# Patient Record
Sex: Female | Born: 1985 | Race: Black or African American | Hispanic: No | Marital: Married | Smoking: Former smoker
Health system: Southern US, Community
[De-identification: ages and names within clinical notes are randomized; demographics above are authoritative.]

## PROBLEM LIST (undated history)

## (undated) DIAGNOSIS — E282 Polycystic ovarian syndrome: Secondary | ICD-10-CM

## (undated) HISTORY — PX: APPENDECTOMY: SHX54

## (undated) HISTORY — PX: OTHER SURGICAL HISTORY: SHX169

---
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
## 2004-01-22 ENCOUNTER — Emergency Department (HOSPITAL_COMMUNITY): Admission: EM | Admit: 2004-01-22 | Discharge: 2004-01-22 | Payer: Self-pay | Admitting: Family Medicine

## 2005-02-25 ENCOUNTER — Inpatient Hospital Stay (HOSPITAL_COMMUNITY): Admission: AD | Admit: 2005-02-25 | Discharge: 2005-02-25 | Payer: Self-pay | Admitting: Obstetrics and Gynecology

## 2006-04-13 ENCOUNTER — Emergency Department (HOSPITAL_COMMUNITY): Admission: EM | Admit: 2006-04-13 | Discharge: 2006-04-13 | Payer: Self-pay | Admitting: Emergency Medicine

## 2006-11-28 ENCOUNTER — Emergency Department (HOSPITAL_COMMUNITY): Admission: EM | Admit: 2006-11-28 | Discharge: 2006-11-28 | Payer: Self-pay | Admitting: Family Medicine

## 2008-01-26 ENCOUNTER — Emergency Department (HOSPITAL_COMMUNITY): Admission: EM | Admit: 2008-01-26 | Discharge: 2008-01-26 | Payer: Self-pay | Admitting: Family Medicine

## 2008-03-17 ENCOUNTER — Emergency Department (HOSPITAL_COMMUNITY): Admission: EM | Admit: 2008-03-17 | Discharge: 2008-03-17 | Payer: Self-pay | Admitting: Emergency Medicine

## 2008-05-10 ENCOUNTER — Ambulatory Visit: Payer: Self-pay | Admitting: Nurse Practitioner

## 2008-05-10 DIAGNOSIS — S99919A Unspecified injury of unspecified ankle, initial encounter: Secondary | ICD-10-CM

## 2008-05-10 DIAGNOSIS — S99929A Unspecified injury of unspecified foot, initial encounter: Secondary | ICD-10-CM

## 2008-05-10 DIAGNOSIS — F17200 Nicotine dependence, unspecified, uncomplicated: Secondary | ICD-10-CM

## 2008-05-10 DIAGNOSIS — S8990XA Unspecified injury of unspecified lower leg, initial encounter: Secondary | ICD-10-CM | POA: Insufficient documentation

## 2008-05-10 HISTORY — DX: Nicotine dependence, unspecified, uncomplicated: F17.200

## 2008-05-12 ENCOUNTER — Ambulatory Visit (HOSPITAL_COMMUNITY): Admission: RE | Admit: 2008-05-12 | Discharge: 2008-05-12 | Payer: Self-pay | Admitting: Internal Medicine

## 2008-05-16 DIAGNOSIS — S83509A Sprain of unspecified cruciate ligament of unspecified knee, initial encounter: Secondary | ICD-10-CM

## 2008-05-16 HISTORY — DX: Sprain of unspecified cruciate ligament of unspecified knee, initial encounter: S83.509A

## 2008-06-14 ENCOUNTER — Encounter (INDEPENDENT_AMBULATORY_CARE_PROVIDER_SITE_OTHER): Payer: Self-pay | Admitting: Nurse Practitioner

## 2009-02-15 ENCOUNTER — Inpatient Hospital Stay (HOSPITAL_COMMUNITY): Admission: AD | Admit: 2009-02-15 | Discharge: 2009-02-15 | Payer: Self-pay | Admitting: Obstetrics & Gynecology

## 2009-05-10 ENCOUNTER — Ambulatory Visit (HOSPITAL_COMMUNITY): Admission: RE | Admit: 2009-05-10 | Discharge: 2009-05-10 | Payer: Self-pay | Admitting: Obstetrics

## 2010-10-18 LAB — GC/CHLAMYDIA PROBE AMP, GENITAL: Chlamydia, DNA Probe: POSITIVE — AB

## 2010-10-18 LAB — URINALYSIS, ROUTINE W REFLEX MICROSCOPIC
Glucose, UA: NEGATIVE mg/dL
Ketones, ur: NEGATIVE mg/dL
Nitrite: NEGATIVE
Protein, ur: NEGATIVE mg/dL
Urobilinogen, UA: 0.2 mg/dL (ref 0.0–1.0)
pH: 6 (ref 5.0–8.0)

## 2010-10-18 LAB — URINE MICROSCOPIC-ADD ON

## 2010-10-18 LAB — WET PREP, GENITAL: Trich, Wet Prep: NONE SEEN

## 2010-12-04 ENCOUNTER — Other Ambulatory Visit (HOSPITAL_COMMUNITY): Payer: Self-pay | Admitting: Obstetrics and Gynecology

## 2010-12-04 DIAGNOSIS — N979 Female infertility, unspecified: Secondary | ICD-10-CM

## 2011-05-05 ENCOUNTER — Inpatient Hospital Stay (INDEPENDENT_AMBULATORY_CARE_PROVIDER_SITE_OTHER)
Admission: RE | Admit: 2011-05-05 | Discharge: 2011-05-05 | Disposition: A | Discharge status: Home / Self Care | Payer: Self-pay | Source: Ambulatory Visit | Attending: Family Medicine | Admitting: Family Medicine

## 2011-05-05 DIAGNOSIS — N390 Urinary tract infection, site not specified: Secondary | ICD-10-CM

## 2011-05-05 LAB — POCT URINALYSIS DIP (DEVICE)
Nitrite: NEGATIVE
Specific Gravity, Urine: >=1.03 (ref 1.005–1.030)
Urobilinogen, UA: 0.2 mg/dL (ref 0.0–1.0)

## 2011-07-08 ENCOUNTER — Ambulatory Visit (HOSPITAL_COMMUNITY)
Admission: RE | Admit: 2011-07-08 | Discharge: 2011-07-08 | Disposition: A | Discharge status: Home / Self Care | Payer: Self-pay | Source: Ambulatory Visit | Attending: Obstetrics and Gynecology | Admitting: Obstetrics and Gynecology

## 2011-07-08 DIAGNOSIS — N979 Female infertility, unspecified: Secondary | ICD-10-CM | POA: Insufficient documentation

## 2011-07-08 MED ORDER — IOHEXOL 300 MG/ML  SOLN
20.0000 mL | Freq: Once | INTRAMUSCULAR | Status: AC | PRN
  Administered 2011-07-08: 20 mL

## 2011-09-09 ENCOUNTER — Encounter (HOSPITAL_COMMUNITY): Payer: Self-pay | Admitting: *Deleted

## 2011-09-09 ENCOUNTER — Emergency Department (HOSPITAL_COMMUNITY)
Admission: EM | Admit: 2011-09-09 | Discharge: 2011-09-09 | Disposition: A | Discharge status: Home / Self Care | Payer: No Typology Code available for payment source | Attending: Emergency Medicine | Admitting: Emergency Medicine

## 2011-09-09 DIAGNOSIS — T1490XA Injury, unspecified, initial encounter: Secondary | ICD-10-CM | POA: Insufficient documentation

## 2011-09-09 DIAGNOSIS — M25559 Pain in unspecified hip: Secondary | ICD-10-CM | POA: Insufficient documentation

## 2011-09-09 DIAGNOSIS — M25551 Pain in right hip: Secondary | ICD-10-CM

## 2011-09-09 HISTORY — DX: Polycystic ovarian syndrome: E28.2

## 2011-09-09 MED ORDER — ORPHENADRINE CITRATE ER 100 MG PO TB12: 100.0000 mg | ORAL_TABLET | Freq: Two times a day (BID) | ORAL | Status: AC

## 2011-09-09 MED ORDER — IBUPROFEN 800 MG PO TABS: 800.0000 mg | ORAL_TABLET | Freq: Three times a day (TID) | ORAL | Status: AC | PRN
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
## 2011-09-09 NOTE — ED Provider Notes (Signed)
Medical screening examination/treatment/procedure(s) were performed by non-physician practitioner and as supervising physician I was immediately available for consultation/collaboration.  Savior Himebaugh, MD 09/09/11 1811 
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
## 2011-09-10 ENCOUNTER — Emergency Department (HOSPITAL_COMMUNITY): Payer: No Typology Code available for payment source

## 2011-09-10 ENCOUNTER — Emergency Department (HOSPITAL_COMMUNITY)
Admission: EM | Admit: 2011-09-10 | Discharge: 2011-09-10 | Disposition: A | Discharge status: Home / Self Care | Payer: No Typology Code available for payment source | Attending: Emergency Medicine | Admitting: Emergency Medicine

## 2011-09-10 ENCOUNTER — Telehealth (HOSPITAL_COMMUNITY): Payer: Self-pay | Admitting: *Deleted

## 2011-09-10 ENCOUNTER — Encounter (HOSPITAL_COMMUNITY): Payer: Self-pay | Admitting: *Deleted

## 2011-09-10 DIAGNOSIS — M545 Low back pain, unspecified: Secondary | ICD-10-CM | POA: Insufficient documentation

## 2011-09-10 DIAGNOSIS — E282 Polycystic ovarian syndrome: Secondary | ICD-10-CM | POA: Insufficient documentation

## 2011-09-10 DIAGNOSIS — M25559 Pain in unspecified hip: Secondary | ICD-10-CM | POA: Insufficient documentation

## 2011-09-10 DIAGNOSIS — M546 Pain in thoracic spine: Secondary | ICD-10-CM | POA: Insufficient documentation

## 2011-09-10 DIAGNOSIS — F17200 Nicotine dependence, unspecified, uncomplicated: Secondary | ICD-10-CM | POA: Insufficient documentation

## 2011-09-10 MED ORDER — HYDROCODONE-ACETAMINOPHEN 5-325 MG PO TABS
1.0000 | ORAL_TABLET | Freq: Once | ORAL | Status: AC
  Administered 2011-09-10: 1 via ORAL
  Filled 2011-09-10: qty 1

## 2011-09-10 MED ORDER — HYDROCODONE-ACETAMINOPHEN 5-325 MG PO TABS: 2.0000 | ORAL_TABLET | ORAL | Status: AC | PRN
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
## 2011-09-10 NOTE — ED Notes (Signed)
[facility] called and requested prior authorization for Norflex. I told her we don't do prior authorizations. I accessed chart and pt. as seen in ED on 2/27. I referred her to the flow manager [phone] to see if medication can be changed to something else. Vassie Moselle 09/10/2011
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
## 2011-09-12 NOTE — ED Provider Notes (Signed)
Medical screening examination/treatment/procedure(s) were performed by non-physician practitioner and as supervising physician I was immediately available for consultation/collaboration.  Raeford Razor, MD 09/12/11 [phone]

## 2011-10-28 ENCOUNTER — Emergency Department (HOSPITAL_COMMUNITY)
Admission: EM | Admit: 2011-10-28 | Discharge: 2011-10-29 | Disposition: A | Discharge status: Home / Self Care | Payer: Medicaid Other | Attending: Emergency Medicine | Admitting: Emergency Medicine

## 2011-10-28 ENCOUNTER — Emergency Department (HOSPITAL_COMMUNITY): Payer: Medicaid Other

## 2011-10-28 ENCOUNTER — Encounter (HOSPITAL_COMMUNITY): Payer: Self-pay | Admitting: Emergency Medicine

## 2011-10-28 DIAGNOSIS — M79609 Pain in unspecified limb: Secondary | ICD-10-CM | POA: Insufficient documentation

## 2011-10-28 DIAGNOSIS — M25569 Pain in unspecified knee: Secondary | ICD-10-CM | POA: Insufficient documentation

## 2011-10-28 DIAGNOSIS — M25562 Pain in left knee: Secondary | ICD-10-CM

## 2011-10-28 MED ORDER — KETOROLAC TROMETHAMINE 60 MG/2ML IM SOLN
60.0000 mg | Freq: Once | INTRAMUSCULAR | Status: AC
  Administered 2011-10-29: 60 mg via INTRAMUSCULAR
  Filled 2011-10-28: qty 2

## 2011-10-28 NOTE — ED Notes (Signed)
Pt states she is having pain in her left knee  Pt states her knee gave out on her this evening   Pt states she has swelling without bruising
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
## 2011-10-29 MED ORDER — HYDROCODONE-ACETAMINOPHEN 5-325 MG PO TABS: 1.0000 | ORAL_TABLET | ORAL | Status: DC | PRN

## 2011-10-29 MED ORDER — TRAMADOL HCL 50 MG PO TABS: 50.0000 mg | ORAL_TABLET | Freq: Four times a day (QID) | ORAL | Status: AC | PRN
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
## 2011-10-29 NOTE — Discharge Instructions (Signed)
Your xray was normal. Your exam did not show any obvious ligament tears. You've been placed in a knee sleeve and on crutches. Please practice RICE - rest, ice, compression, and elevation. Use the pain medication as needed. Call Dr. Stacy Gardner office to make a follow up appointment for further evaluation and treatment. Return to the ER if you develop numbness, weakness, worsening swelling, or other worrisome symptoms.  Knee Pain The knee is the complex joint between your thigh and your lower leg. It is made up of bones, tendons, ligaments, and cartilage. The bones that make up the knee are:  The femur in the thigh.   The tibia and fibula in the lower leg.   The patella or kneecap riding in the groove on the lower femur.  CAUSES  Knee pain is a common complaint with many causes. A few of these causes are:  Injury, such as:   A ruptured ligament or tendon injury.   Torn cartilage.   Medical conditions, such as:   Gout   Arthritis   Infections   Overuse, over training or overdoing a physical activity.  Knee pain can be minor or severe. Knee pain can accompany debilitating injury. Minor knee problems often respond well to self-care measures or get well on their own. More serious injuries may need medical intervention or even surgery. SYMPTOMS The knee is complex. Symptoms of knee problems can vary widely. Some of the problems are:  Pain with movement and weight bearing.   Swelling and tenderness.   Buckling of the knee.   Inability to straighten or extend your knee.   Your knee locks and you cannot straighten it.   Warmth and redness with pain and fever.   Deformity or dislocation of the kneecap.  DIAGNOSIS  Determining what is wrong may be very straight forward such as when there is an injury. It can also be challenging because of the complexity of the knee. Tests to make a diagnosis may include:  Your caregiver taking a history and doing a physical exam.   Routine X-rays  can be used to rule out other problems. X-rays will not reveal a cartilage tear. Some injuries of the knee can be diagnosed by:   Arthroscopy a surgical technique by which a small video camera is inserted through tiny incisions on the sides of the knee. This procedure is used to examine and repair internal knee joint problems. Tiny instruments can be used during arthroscopy to repair the torn knee cartilage (meniscus).   Arthrography is a radiology technique. A contrast liquid is directly injected into the knee joint. Internal structures of the knee joint then become visible on X-ray film.   An MRI scan is a non x-ray radiology procedure in which magnetic fields and a computer produce two- or three-dimensional images of the inside of the knee. Cartilage tears are often visible using an MRI scanner. MRI scans have largely replaced arthrography in diagnosing cartilage tears of the knee.   Blood work.   Examination of the fluid that helps to lubricate the knee joint (synovial fluid). This is done by taking a sample out using a needle and a syringe.  TREATMENT The treatment of knee problems depends on the cause. Some of these treatments are:  Depending on the injury, proper casting, splinting, surgery or physical therapy care will be needed.   Give yourself adequate recovery time. Do not overuse your joints. If you begin to get sore during workout routines, back off. Slow down or do  fewer repetitions.   For repetitive activities such as cycling or running, maintain your strength and nutrition.   Alternate muscle groups. For example if you are a weight lifter, work the upper body on one day and the lower body the next.   Either tight or weak muscles do not give the proper support for your knee. Tight or weak muscles do not absorb the stress placed on the knee joint. Keep the muscles surrounding the knee strong.   Take care of mechanical problems.   If you have flat feet, orthotics or special  shoes may help. See your caregiver if you need help.   Arch supports, sometimes with wedges on the inner or outer aspect of the heel, can help. These can shift pressure away from the side of the knee most bothered by osteoarthritis.   A brace called an "unloader" brace also may be used to help ease the pressure on the most arthritic side of the knee.   If your caregiver has prescribed crutches, braces, wraps or ice, use as directed. The acronym for this is PRICE. This means protection, rest, ice, compression and elevation.   Nonsteroidal anti-inflammatory drugs (NSAID's), can help relieve pain. But if taken immediately after an injury, they may actually increase swelling. Take NSAID's with food in your stomach. Stop them if you develop stomach problems. Do not take these if you have a history of ulcers, stomach pain or bleeding from the bowel. Do not take without your caregiver's approval if you have problems with fluid retention, heart failure, or kidney problems.   For ongoing knee problems, physical therapy may be helpful.   Glucosamine and chondroitin are over-the-counter dietary supplements. Both may help relieve the pain of osteoarthritis in the knee. These medicines are different from the usual anti-inflammatory drugs. Glucosamine may decrease the rate of cartilage destruction.   Injections of a corticosteroid drug into your knee joint may help reduce the symptoms of an arthritis flare-up. They may provide pain relief that lasts a few months. You may have to wait a few months between injections. The injections do have a small increased risk of infection, water retention and elevated blood sugar levels.   Hyaluronic acid injected into damaged joints may ease pain and provide lubrication. These injections may work by reducing inflammation. A series of shots may give relief for as long as 6 months.   Topical painkillers. Applying certain ointments to your skin may help relieve the pain and  stiffness of osteoarthritis. Ask your pharmacist for suggestions. Many over the-counter products are approved for temporary relief of arthritis pain.   In some countries, doctors often prescribe topical NSAID's for relief of chronic conditions such as arthritis and tendinitis. A review of treatment with NSAID creams found that they worked as well as oral medications but without the serious side effects.  PREVENTION  Maintain a healthy weight. Extra pounds put more strain on your joints.   Get strong, stay limber. Weak muscles are a common cause of knee injuries. Stretching is important. Include flexibility exercises in your workouts.   Be smart about exercise. If you have osteoarthritis, chronic knee pain or recurring injuries, you may need to change the way you exercise. This does not mean you have to stop being active. If your knees ache after jogging or playing basketball, consider switching to swimming, water aerobics or other low-impact activities, at least for a few days a week. Sometimes limiting high-impact activities will provide relief.   Make sure your shoes  fit well. Choose footwear that is right for your sport.   Protect your knees. Use the proper gear for knee-sensitive activities. Use kneepads when playing volleyball or laying carpet. Buckle your seat belt every time you drive. Most shattered kneecaps occur in car accidents.   Rest when you are tired.  SEEK MEDICAL CARE IF:  You have knee pain that is continual and does not seem to be getting better.  SEEK IMMEDIATE MEDICAL CARE IF:  Your knee joint feels hot to the touch and you have a high fever. MAKE SURE YOU:   Understand these instructions.   Will watch your condition.   Will get help right away if you are not doing well or get worse.  Document Released: 04/26/2007 Document Revised: 06/18/2011 Document Reviewed: 04/26/2007 [facility] Patient Information 2012 [place], [place].

## 2011-10-29 NOTE — ED Provider Notes (Signed)
Medical screening examination/treatment/procedure(s) were performed by non-physician practitioner and as supervising physician I was immediately available for consultation/collaboration. Jaray Boliver, MD, FACEP   Itsel Opfer L Dorethea Strubel, MD 10/29/11 2124 

## 2012-07-19 ENCOUNTER — Encounter (HOSPITAL_COMMUNITY): Payer: Self-pay | Admitting: *Deleted

## 2012-07-19 ENCOUNTER — Emergency Department (HOSPITAL_COMMUNITY)
Admission: EM | Admit: 2012-07-19 | Discharge: 2012-07-20 | Disposition: A | Discharge status: Home / Self Care | Payer: Medicaid Other | Attending: Emergency Medicine | Admitting: Emergency Medicine

## 2012-07-19 DIAGNOSIS — R209 Unspecified disturbances of skin sensation: Secondary | ICD-10-CM | POA: Insufficient documentation

## 2012-07-19 DIAGNOSIS — Z8742 Personal history of other diseases of the female genital tract: Secondary | ICD-10-CM | POA: Insufficient documentation

## 2012-07-19 DIAGNOSIS — R200 Anesthesia of skin: Secondary | ICD-10-CM

## 2012-07-19 DIAGNOSIS — Z87891 Personal history of nicotine dependence: Secondary | ICD-10-CM | POA: Insufficient documentation

## 2012-07-19 DIAGNOSIS — R1110 Vomiting, unspecified: Secondary | ICD-10-CM | POA: Insufficient documentation

## 2012-07-19 NOTE — ED Notes (Signed)
Mouth numbness for 45 minutes agio  After finding a bug in her greens that she had eaten.  She has the bug with her

## 2012-07-20 NOTE — ED Provider Notes (Signed)
History     CSN: [id number]  Arrival date & time 07/19/12  2211   None     Chief Complaint  Patient presents with  . mouth numbness     (Consider location/radiation/quality/duration/timing/severity/associated sxs/prior treatment) HPI History provided by pt.   Pt chewed on a dead cockroach from a can of collard beans yesterday evening and developed numbness right posterior mouth shortly after.  Had been chewing on that same side.  Concerned that the it may have been poisonous, though admits that her symptoms are likely psychogenic.  She has vomited twice since finding the insect in her mouth.  Past Medical History  Diagnosis Date  . Polycystic disease, ovaries     Past Surgical History  Procedure Date  . Extraction of wisdom teeth     Family History  Problem Relation Age of Onset  . Hypertension Mother   . Diabetes Other   . Hypertension Other     History  Substance Use Topics  . Smoking status: Former Smoker -- 0.5 packs/day  . Smokeless tobacco: Not on file  . Alcohol Use: No    OB History    Grav Para Term Preterm Abortions TAB SAB Ect Mult Living                  Review of Systems  All other systems reviewed and are negative.    Allergies  Review of patient's allergies indicates no known allergies.  Home Medications   Current Outpatient Rx  Name  Route  Sig  Dispense  Refill  . PRENATAL MULTIVITAMIN CH   Oral   Take 1 tablet by mouth daily.           BP 123/75  Pulse 88  Temp 98.2 F (36.8 C) (Oral)  Resp 20  SpO2 100%  Physical Exam  Nursing note and vitals reviewed. Constitutional: She is oriented to person, place, and time. She appears well-developed and well-nourished. No distress.       Pt has what appears to be half of a cockroach in a sandwich bag with her.  She begins retching when she pulls it out.   HENT:  Head: Normocephalic and atraumatic.       Braces.  Decreased sensation at gingiva posteiror to 2nd right lower molar  compared to rest of oral mucosa.  No visible edema, erythema, lesion of tissue.   Eyes:       Normal appearance  Neck: Normal range of motion.  Pulmonary/Chest: Effort normal.  Musculoskeletal: Normal range of motion.  Neurological: She is alert and oriented to person, place, and time.  Psychiatric: She has a normal mood and affect. Her behavior is normal.    ED Course  Procedures (including critical care time)  Labs Reviewed - No data to display No results found.   1. Numbness       MDM  26yo healthy F presents w/ numbness right lower posterior gingiva s/p finding a dead cockroach in collard greens yesterday evening.  Sensation is intact.  Suspect that sx are psychogenic.  Pt has been retching in ED and attributes to thought of having insect in her mouth.  She has been reassured and Return precautions discussed.         Otilio Miu, PA-C 07/20/12 2152

## 2012-07-20 NOTE — ED Provider Notes (Signed)
  Medical screening examination/treatment/procedure(s) were performed by non-physician practitioner and as supervising physician I was immediately available for consultation/collaboration.    Vida Roller, MD 07/20/12 [phone]

## 2013-12-23 ENCOUNTER — Encounter (HOSPITAL_COMMUNITY): Payer: Self-pay | Admitting: Emergency Medicine

## 2013-12-23 ENCOUNTER — Emergency Department (HOSPITAL_COMMUNITY)
Admission: EM | Admit: 2013-12-23 | Discharge: 2013-12-23 | Disposition: A | Discharge status: Home / Self Care | Payer: No Typology Code available for payment source | Attending: Emergency Medicine | Admitting: Emergency Medicine

## 2013-12-23 DIAGNOSIS — N390 Urinary tract infection, site not specified: Secondary | ICD-10-CM | POA: Insufficient documentation

## 2013-12-23 DIAGNOSIS — Z792 Long term (current) use of antibiotics: Secondary | ICD-10-CM | POA: Insufficient documentation

## 2013-12-23 DIAGNOSIS — Z87891 Personal history of nicotine dependence: Secondary | ICD-10-CM | POA: Insufficient documentation

## 2013-12-23 DIAGNOSIS — Z3202 Encounter for pregnancy test, result negative: Secondary | ICD-10-CM | POA: Insufficient documentation

## 2013-12-23 DIAGNOSIS — N898 Other specified noninflammatory disorders of vagina: Secondary | ICD-10-CM | POA: Insufficient documentation

## 2013-12-23 LAB — URINALYSIS, ROUTINE W REFLEX MICROSCOPIC
BILIRUBIN URINE: NEGATIVE
Glucose, UA: NEGATIVE mg/dL
KETONES UR: NEGATIVE mg/dL
Nitrite: POSITIVE — AB
PH: 6.5 (ref 5.0–8.0)
Protein, ur: 30 mg/dL — AB
SPECIFIC GRAVITY, URINE: 1.027 (ref 1.005–1.030)
UROBILINOGEN UA: 1 mg/dL (ref 0.0–1.0)

## 2013-12-23 LAB — WET PREP, GENITAL
CLUE CELLS WET PREP: NONE SEEN
TRICH WET PREP: NONE SEEN
WBC, Wet Prep HPF POC: NONE SEEN
Yeast Wet Prep HPF POC: NONE SEEN

## 2013-12-23 LAB — URINE MICROSCOPIC-ADD ON

## 2013-12-23 LAB — POC URINE PREG, ED: Preg Test, Ur: NEGATIVE

## 2013-12-23 MED ORDER — CEPHALEXIN 500 MG PO CAPS: 500.0000 mg | ORAL_CAPSULE | Freq: Two times a day (BID) | ORAL | Status: DC

## 2013-12-23 MED ORDER — SODIUM CHLORIDE 0.9 % IV BOLUS (SEPSIS)
1000.0000 mL | Freq: Once | INTRAVENOUS | Status: AC
  Administered 2013-12-23: 1000 mL via INTRAVENOUS

## 2013-12-23 MED ORDER — DEXTROSE 5 % IV SOLN
1.0000 g | Freq: Once | INTRAVENOUS | Status: AC
  Administered 2013-12-23: 1 g via INTRAVENOUS
  Filled 2013-12-23: qty 10

## 2013-12-23 NOTE — Discharge Instructions (Signed)
Urinary Tract Infection  Urinary tract infections (UTIs) can develop anywhere along your urinary tract. Your urinary tract is your body's drainage system for removing wastes and extra water. Your urinary tract includes two kidneys, two ureters, a bladder, and a urethra. Your kidneys are a pair of bean-shaped organs. Each kidney is about the size of your fist. They are located below your ribs, one on each side of your spine.  CAUSES  Infections are caused by microbes, which are microscopic organisms, including fungi, viruses, and bacteria. These organisms are so small that they can only be seen through a microscope. Bacteria are the microbes that most commonly cause UTIs.  SYMPTOMS   Symptoms of UTIs may vary by age and gender of the patient and by the location of the infection. Symptoms in young women typically include a frequent and intense urge to urinate and a painful, burning feeling in the bladder or urethra during urination. Older women and men are more likely to be tired, shaky, and weak and have muscle aches and abdominal pain. A fever may mean the infection is in your kidneys. Other symptoms of a kidney infection include pain in your back or sides below the ribs, nausea, and vomiting.  DIAGNOSIS  To diagnose a UTI, your caregiver will ask you about your symptoms. Your caregiver also will ask to provide a urine sample. The urine sample will be tested for bacteria and white blood cells. White blood cells are made by your body to help fight infection.  TREATMENT   Typically, UTIs can be treated with medication. Because most UTIs are caused by a bacterial infection, they usually can be treated with the use of antibiotics. The choice of antibiotic and length of treatment depend on your symptoms and the type of bacteria causing your infection.  HOME CARE INSTRUCTIONS   If you were prescribed antibiotics, take them exactly as your caregiver instructs you. Finish the medication even if you feel better after you  have only taken some of the medication.   Drink enough water and fluids to keep your urine clear or pale yellow.   Avoid caffeine, tea, and carbonated beverages. They tend to irritate your bladder.   Empty your bladder often. Avoid holding urine for long periods of time.   Empty your bladder before and after sexual intercourse.   After a bowel movement, women should cleanse from front to back. Use each tissue only once.  SEEK MEDICAL CARE IF:    You have back pain.   You develop a fever.   Your symptoms do not begin to resolve within 3 days.  SEEK IMMEDIATE MEDICAL CARE IF:    You have severe back pain or lower abdominal pain.   You develop chills.   You have nausea or vomiting.   You have continued burning or discomfort with urination.  MAKE SURE YOU:    Understand these instructions.   Will watch your condition.   Will get help right away if you are not doing well or get worse.  Document Released: 04/08/2005 Document Revised: 12/29/2011 Document Reviewed: 08/07/2011  ExitCare Patient Information 2014 [employer].

## 2013-12-23 NOTE — ED Provider Notes (Signed)
CSN: [id number]     Arrival date & time 12/23/13  0223 History   First MD Initiated Contact with Patient 12/23/13 0226     Chief Complaint  Patient presents with  . Dysuria    (Consider location/radiation/quality/duration/timing/severity/associated sxs/prior Treatment) HPI Comments: Patient is a 28 year old female who presents to the emergency department for abdominal pain. Patient states that she has been feeling a sharp pain intermittently over the last 4 days. Patient has the pain only right before urinating or after urinating. She states the pain is present in her right lower abdomen and right flank. Patient states that she experienced some dysuria 2 days ago, but this resolved. She also endorses foul-smelling urine as well as off white vaginal discharge. Patient denies associated fever or, nausea or vomiting, diarrhea, vaginal bleeding, urinary urgency or frequency, and pain with intercourse. Patient states her last menstrual period was in April. Patient states she has no concern for pregnancy; however, she states she is sexually active without the use of birth control or protection. No history of abdominal surgeries.  Patient is a 28 y.o. female presenting with dysuria. The history is provided by the patient. No language interpreter was used.  Dysuria Associated symptoms: abdominal pain and vaginal discharge   Associated symptoms: no fever, no nausea and no vomiting     Past Medical History  Diagnosis Date  . Polycystic disease, ovaries    Past Surgical History  Procedure Laterality Date  . Extraction of wisdom teeth     Family History  Problem Relation Age of Onset  . Hypertension Mother   . Diabetes Other   . Hypertension Other    History  Substance Use Topics  . Smoking status: Former Smoker -- 0.50 packs/day  . Smokeless tobacco: Not on file  . Alcohol Use: Yes     Comment: occasional   OB History   Grav Para Term Preterm Abortions TAB SAB Ect Mult Living        Review of Systems  Constitutional: Negative for fever.  Respiratory: Negative for shortness of breath.   Cardiovascular: Negative for chest pain.  Gastrointestinal: Positive for abdominal pain. Negative for nausea, vomiting and diarrhea.  Genitourinary: Positive for dysuria and vaginal discharge. Negative for hematuria, decreased urine volume, vaginal bleeding, difficulty urinating and pelvic pain.  Neurological: Negative for syncope.  All other systems reviewed and are negative.    Allergies  Review of patient's allergies indicates no known allergies.  Home Medications   Prior to Admission medications   Medication Sig Start Date End Date Taking? Authorizing Provider  Prenatal Vit-Fe Fumarate-FA (PRENATAL MULTIVITAMIN) TABS Take 1 tablet by mouth daily.   Yes Historical Provider, MD  cephALEXin (KEFLEX) 500 MG capsule Take 1 capsule (500 mg total) by mouth 2 (two) times daily. 12/23/13   Antony MaduraKelly Jojo Geving, PA-C   BP 118/85  Pulse 83  Temp(Src) 98.1 F (36.7 C) (Oral)  Resp 18  Ht 5\' 4"  (1.626 m)  Wt 160 lb (72.576 kg)  BMI 27.45 kg/m2  SpO2 100%  LMP 10/30/2013  Physical Exam  Nursing note and vitals reviewed. Constitutional: She is oriented to person, place, and time. She appears well-developed and well-nourished. No distress.  Nontoxic/nonseptic appearing  HENT:  Head: Normocephalic and atraumatic.  Eyes: Conjunctivae and EOM are normal. No scleral icterus.  Neck: Normal range of motion.  Cardiovascular: Normal rate, regular rhythm and normal heart sounds.   Pulmonary/Chest: Effort normal and breath sounds normal. No respiratory distress. She has no wheezes. She  has no rales.  Chest expansion symmetric  Abdominal: Soft. She exhibits no distension. There is tenderness. There is no rebound and no guarding.  Abdomen soft. Mild tenderness to deep palpation in the suprapubic region. Mild R CVA TTP. No peritoneal signs.  Genitourinary: There is no rash, tenderness, lesion  or injury on the right labia. There is no rash, tenderness, lesion or injury on the left labia. Uterus is not enlarged and not fixed. Cervix exhibits no motion tenderness and no friability. Right adnexum displays no mass and no fullness. Left adnexum displays no mass and no fullness. No erythema or tenderness around the vagina. Vaginal discharge (white, thick) found.  Musculoskeletal: Normal range of motion.  Neurological: She is alert and oriented to person, place, and time.  Skin: Skin is warm and dry. No rash noted. She is not diaphoretic. No erythema. No pallor.  Psychiatric: She has a normal mood and affect. Her behavior is normal.    ED Course  Procedures (including critical care time) Labs Review Labs Reviewed  URINALYSIS, ROUTINE W REFLEX MICROSCOPIC - Abnormal; Notable for the following:    APPearance CLOUDY (*)    Hgb urine dipstick SMALL (*)    Protein, ur 30 (*)    Nitrite POSITIVE (*)    Leukocytes, UA SMALL (*)    All other components within normal limits  URINE MICROSCOPIC-ADD ON - Abnormal; Notable for the following:    Bacteria, UA FEW (*)    All other components within normal limits  WET PREP, GENITAL  GC/CHLAMYDIA PROBE AMP  URINE CULTURE  POC URINE PREG, ED    Imaging Review No results found.   EKG Interpretation None      MDM   Final diagnoses:  UTI (urinary tract infection)    Patient presents for dysuria, suprapubic abdominal discomfort and mild, sporadic pain in R flank. Patient well and nontoxic appearing, hemodynamically stable, and afebrile. Physical exam significant for mild tenderness to deep palpation in the suprapubic region as well has mild CVA tenderness. Abdomen soft without peritoneal signs.  Workup today included urinalysis which is suggestive of urinary tract infection. Urine pregnancy negative. Pelvic exam also completed; pelvic exam without distinct tenderness. No cervical motion tenderness. Wet prep negative for yeast, Trichomonas,  clue cells, and white blood cells. Given associated dysuria and urinalysis, will treat for urinary tract infection. Urine culture pending Sporadic pain and flank may be representative of early pyelonephritis; 1 g IV Rocephin administered.  Patient lying in exam room bed in no visible or audible discomfort. Abdominal reexamination improved from initial exam as patient with no significant tenderness while distracted. She is stable for discharge with prescription for Keflex. Primary care followup advised and return precautions provided. Patient agreeable to plan with no unaddressed concerns.   Filed Vitals:   12/23/13 0229 12/23/13 0502  BP: 129/83 118/85  Pulse: 81 83  Temp: 99.1 F (37.3 C) 98.1 F (36.7 C)  TempSrc: Oral Oral  Resp: 16 18  Height: 5\' 4"  (1.626 m)   Weight: 160 lb (72.576 kg)   SpO2: 100% 100%       Antony MaduraKelly Baani Bober, PA-C 12/23/13 [phone]

## 2013-12-23 NOTE — ED Notes (Signed)
Pt reports dysuria, foul smelling urine, off-white vaginal discharge, and pelvic cramping/ pressure x 4-5 days.

## 2013-12-24 NOTE — ED Provider Notes (Signed)
Medical screening examination/treatment/procedure(s) were performed by non-physician practitioner and as supervising physician I was immediately available for consultation/collaboration.   EKG Interpretation None       Derwood KaplanAnkit Dionna Wiedemann, MD 12/24/13 [phone]

## 2013-12-25 LAB — URINE CULTURE: Colony Count: >=100000

## 2013-12-25 LAB — GC/CHLAMYDIA PROBE AMP
CT PROBE, AMP APTIMA: NEGATIVE
GC PROBE AMP APTIMA: NEGATIVE

## 2013-12-26 ENCOUNTER — Telehealth (HOSPITAL_BASED_OUTPATIENT_CLINIC_OR_DEPARTMENT_OTHER): Payer: Self-pay | Admitting: Emergency Medicine

## 2013-12-26 NOTE — Telephone Encounter (Signed)
Post ED Visit - Positive Culture Follow-up  Culture report reviewed by antimicrobial stewardship pharmacist: []  Wes Dulaney, Pharm.D., BCPS [x]  Celedonio MiyamotoJeremy Frens, Pharm.D., BCPS []  Georgina PillionElizabeth Martin, Pharm.D., BCPS []  Muhlenberg ParkMinh Pham, [address] Rainbow BoulevardPharm.D., BCPS, AAHIVP []  Estella HuskMichelle Turner, Pharm.D., BCPS, AAHIVP []  Harvie JuniorNathan Cope, Pharm.D.  Positive urine culture Treated with Keflex, organism sensitive to the same and no further patient follow-up is required at this time.  Glenn SpringsHolland, Jenel LucksKylie 12/26/2013, 2:18 PM

## 2014-12-15 DIAGNOSIS — Z8639 Personal history of other endocrine, nutritional and metabolic disease: Secondary | ICD-10-CM | POA: Insufficient documentation

## 2014-12-15 DIAGNOSIS — R030 Elevated blood-pressure reading, without diagnosis of hypertension: Secondary | ICD-10-CM | POA: Insufficient documentation

## 2014-12-15 DIAGNOSIS — R0789 Other chest pain: Secondary | ICD-10-CM | POA: Insufficient documentation

## 2014-12-15 DIAGNOSIS — Z87891 Personal history of nicotine dependence: Secondary | ICD-10-CM | POA: Insufficient documentation

## 2014-12-15 DIAGNOSIS — R05 Cough: Secondary | ICD-10-CM | POA: Insufficient documentation

## 2014-12-16 ENCOUNTER — Emergency Department (HOSPITAL_COMMUNITY): Payer: Medicaid Other

## 2014-12-16 ENCOUNTER — Emergency Department (HOSPITAL_COMMUNITY)
Admission: EM | Admit: 2014-12-16 | Discharge: 2014-12-16 | Disposition: A | Discharge status: Home / Self Care | Payer: Medicaid Other | Attending: Emergency Medicine | Admitting: Emergency Medicine

## 2014-12-16 ENCOUNTER — Encounter (HOSPITAL_COMMUNITY): Payer: Self-pay | Admitting: Emergency Medicine

## 2014-12-16 DIAGNOSIS — IMO0001 Reserved for inherently not codable concepts without codable children: Secondary | ICD-10-CM

## 2014-12-16 DIAGNOSIS — R053 Chronic cough: Secondary | ICD-10-CM

## 2014-12-16 DIAGNOSIS — R05 Cough: Secondary | ICD-10-CM

## 2014-12-16 DIAGNOSIS — R030 Elevated blood-pressure reading, without diagnosis of hypertension: Secondary | ICD-10-CM

## 2014-12-16 MED ORDER — PREDNISONE 20 MG PO TABS: ORAL_TABLET | ORAL | Status: DC

## 2014-12-16 MED ORDER — HYDROCHLOROTHIAZIDE 25 MG PO TABS: 25.0000 mg | ORAL_TABLET | Freq: Every day | ORAL | Status: DC

## 2014-12-16 MED ORDER — BENZONATATE 100 MG PO CAPS: 100.0000 mg | ORAL_CAPSULE | Freq: Three times a day (TID) | ORAL | Status: DC

## 2014-12-16 MED ORDER — SALINE SPRAY 0.65 % NA SOLN: 2.0000 | NASAL | Status: DC | PRN

## 2014-12-16 NOTE — ED Notes (Signed)
Pt arrived to the ED with a complaint of a cough.  Pt has had symptoms for a month.  Pt states that today she coughed so hard that she ahd an episode of emesis that had blood tinged.

## 2014-12-16 NOTE — ED Provider Notes (Signed)
CSN: [id number]     Arrival date & time 12/15/14  2323 History   First MD Initiated Contact with Patient 12/16/14 0559     Chief Complaint  Patient presents with  . Cough     (Consider location/radiation/quality/duration/timing/severity/associated sxs/prior Treatment) HPI Pt is a 29yo female presenting to ED with c/o persistent cough for 1 month.  Cough is intermittent but moderate to severe in nature, states yesterday she was coughing so much she vomited once, it contained bright red streaks, pt concerned for blood.  Pt also reports chest pain from the coughing, pain is worse with deep inspiration. Pt denies CP or SOB at this time. Denies fever, chills, nausea, diarrhea, rash, sick contacts or recent travel. Pt states she was told she may have had asthma as a child but states "it was never bad"  Pt does not have an inhaler at home.  Denies hx of DVT or PE. She is not on birth control. Denies leg pain or swelling.   Past Medical History  Diagnosis Date  . Polycystic disease, ovaries    Past Surgical History  Procedure Laterality Date  . Extraction of wisdom teeth     Family History  Problem Relation Age of Onset  . Hypertension Mother   . Diabetes Other   . Hypertension Other    History  Substance Use Topics  . Smoking status: Former Smoker -- 0.50 packs/day  . Smokeless tobacco: Not on file  . Alcohol Use: Yes     Comment: occasional   OB History    No data available     Review of Systems  Constitutional: Negative for fever, chills, diaphoresis, appetite change, fatigue and unexpected weight change.  HENT: Negative for congestion, sore throat, trouble swallowing and voice change.   Respiratory: Positive for cough. Negative for shortness of breath.   Cardiovascular: Positive for chest pain ("from coughing"). Negative for palpitations and leg swelling.  Gastrointestinal: Negative for nausea, vomiting, abdominal pain, diarrhea and constipation.  Musculoskeletal: Negative for  myalgias, back pain and arthralgias.  Skin: Negative for rash.  All other systems reviewed and are negative.     Allergies  Review of patient's allergies indicates no known allergies.  Home Medications   Prior to Admission medications   Medication Sig Start Date End Date Taking? Authorizing Provider  Oxycodone HCl 10 MG TABS Take 10 mg by mouth 3 (three) times daily as needed (pain).  11/29/14  Yes Historical Provider, MD  Prenatal Vit-Fe Fumarate-FA (PRENATAL MULTIVITAMIN) TABS Take 1 tablet by mouth daily.   Yes Historical Provider, MD  benzonatate (TESSALON) 100 MG capsule Take 1 capsule (100 mg total) by mouth every 8 (eight) hours. 12/16/14   Junius FinnerErin O'Malley, PA-C  cephALEXin (KEFLEX) 500 MG capsule Take 1 capsule (500 mg total) by mouth 2 (two) times daily. Patient not taking: Reported on 12/16/2014 12/23/13   Antony MaduraKelly Humes, PA-C  hydrochlorothiazide (HYDRODIURIL) 25 MG tablet Take 1 tablet (25 mg total) by mouth daily. 12/16/14   Junius FinnerErin O'Malley, PA-C  predniSONE (DELTASONE) 20 MG tablet 2 tabs po daily x 3 days 12/16/14   Junius FinnerErin O'Malley, PA-C  sodium chloride (OCEAN) 0.65 % SOLN nasal spray Place 2 sprays into both nostrils as needed for congestion. 12/16/14   Junius FinnerErin O'Malley, PA-C   BP 128/90 mmHg  Pulse 66  Temp(Src) 97.8 F (36.6 C) (Oral)  Resp 16  Ht 5\' 4"  (1.626 m)  Wt 171 lb 12.8 oz (77.928 kg)  BMI 29.47 kg/m2  SpO2 100%  LMP 10/16/2014 (Approximate) Physical Exam  Constitutional: She appears well-developed and well-nourished. No distress.  HENT:  Head: Normocephalic and atraumatic.  Right Ear: Hearing, tympanic membrane, external ear and ear canal normal.  Left Ear: Hearing, tympanic membrane, external ear and ear canal normal.  Nose: Nose normal. Right sinus exhibits no maxillary sinus tenderness and no frontal sinus tenderness. Left sinus exhibits no maxillary sinus tenderness and no frontal sinus tenderness.  Mouth/Throat: Uvula is midline, oropharynx is clear and moist and  mucous membranes are normal.  Eyes: Conjunctivae are normal. No scleral icterus.  Neck: Normal range of motion. Neck supple.  Cardiovascular: Normal rate, regular rhythm and normal heart sounds.   Pulmonary/Chest: Effort normal and breath sounds normal. No respiratory distress. She has no wheezes. She has no rales. She exhibits no tenderness.  No respiratory distress, able to speak in full sentences w/o difficulty. Lungs: CTAB. No coughing during exam.  Abdominal: Soft. Bowel sounds are normal. She exhibits no distension and no mass. There is no tenderness. There is no rebound and no guarding.  Musculoskeletal: Normal range of motion.  Neurological: She is alert.  Skin: Skin is warm and dry. She is not diaphoretic.  Nursing note and vitals reviewed.   ED Course  Procedures (including critical care time) Labs Review Labs Reviewed - No data to display  Imaging Review Dg Chest 2 View (if Patient Has Fever And/or Copd)  12/16/2014   CLINICAL DATA:  Cough for 1 month.  EXAM: CHEST  2 VIEW  COMPARISON:  None.  FINDINGS: The cardiomediastinal contours are normal. The lungs are clear. Pulmonary vasculature is normal. No consolidation, pleural effusion, or pneumothorax. No acute osseous abnormalities are seen.  IMPRESSION: No acute pulmonary process.   Electronically Signed   By: Rubye Oaks M.D.   On: 12/16/2014 06:34     EKG Interpretation None      MDM   Final diagnoses:  Persistent cough  Elevated blood pressure   Pt is a 29yo female c/o moderate intermittent cough, post-tussive vomited once yesterday. No fever, chills, nausea, or diarrhea. Pt does not have asthma but reports mild asthma as a child. Denies sick contacts or recent travel.   No coughing noted by RNs or this provided while pt has been in ED. Lungs: CTAB. PERC negative. Doubt ACS.  CXR: no acute pulmonary process.  Rx: tessalon, 3 days prednisone, and ocean saline nasal spray. Home care instructions provided. Advised  to f/u with PCP this week for recheck of symptoms.  BP in triage 124/11 and 140/106.  No prior hx of HTN.  Pt does not have a PCP. Will start pt on HCTZ, advised to have BP rechecked in 1 week.   Return precautions provided. Pt verbalized understanding and agreement with tx plan.   Junius Finner, PA-C 12/16/14 1138  Linwood Dibbles, MD 12/17/14 [phone]

## 2014-12-16 NOTE — ED Notes (Addendum)
Pt reports that she has had a cough x 1 month. She reports coughing up blood several times and she has vomited after coughing hard.  She reports pain with a deep breath.

## 2014-12-16 NOTE — Discharge Instructions (Signed)
Cool Mist Vaporizers °Vaporizers may help relieve the symptoms of a cough and cold. They add moisture to the air, which helps mucus to become thinner and less sticky. This makes it easier to breathe and cough up secretions. Cool mist vaporizers do not cause serious burns like hot mist vaporizers, which may also be called steamers or humidifiers. Vaporizers have not been proven to help with colds. You should not use a vaporizer if you are allergic to mold. °HOME CARE INSTRUCTIONS °· Follow the package instructions for the vaporizer. °· Do not use anything other than distilled water in the vaporizer. °· Do not run the vaporizer all of the time. This can cause mold or bacteria to grow in the vaporizer. °· Clean the vaporizer after each time it is used. °· Clean and dry the vaporizer well before storing it. °· Stop using the vaporizer if worsening respiratory symptoms develop. °Document Released: 03/26/2004 Document Revised: 07/04/2013 Document Reviewed: 11/16/2012 °Exit[employer]® Patient Information ©2015 [employer]. This information is not intended to replace advice given to you by your health care provider. Make sure you discuss any questions you have with your health care provider. ° °

## 2014-12-30 ENCOUNTER — Emergency Department (HOSPITAL_COMMUNITY): Payer: Medicaid Other

## 2014-12-30 ENCOUNTER — Encounter (HOSPITAL_COMMUNITY): Payer: Self-pay | Admitting: Emergency Medicine

## 2014-12-30 ENCOUNTER — Emergency Department (HOSPITAL_COMMUNITY)
Admission: EM | Admit: 2014-12-30 | Discharge: 2014-12-31 | Disposition: A | Discharge status: Home / Self Care | Payer: Medicaid Other | Attending: Emergency Medicine | Admitting: Emergency Medicine

## 2014-12-30 DIAGNOSIS — Y9389 Activity, other specified: Secondary | ICD-10-CM | POA: Insufficient documentation

## 2014-12-30 DIAGNOSIS — Z87891 Personal history of nicotine dependence: Secondary | ICD-10-CM | POA: Insufficient documentation

## 2014-12-30 DIAGNOSIS — S52502A Unspecified fracture of the lower end of left radius, initial encounter for closed fracture: Secondary | ICD-10-CM

## 2014-12-30 DIAGNOSIS — Y9289 Other specified places as the place of occurrence of the external cause: Secondary | ICD-10-CM | POA: Insufficient documentation

## 2014-12-30 DIAGNOSIS — S52125A Nondisplaced fracture of head of left radius, initial encounter for closed fracture: Secondary | ICD-10-CM | POA: Insufficient documentation

## 2014-12-30 DIAGNOSIS — Z79899 Other long term (current) drug therapy: Secondary | ICD-10-CM | POA: Insufficient documentation

## 2014-12-30 DIAGNOSIS — S52122A Displaced fracture of head of left radius, initial encounter for closed fracture: Secondary | ICD-10-CM

## 2014-12-30 DIAGNOSIS — S00531A Contusion of lip, initial encounter: Secondary | ICD-10-CM | POA: Insufficient documentation

## 2014-12-30 DIAGNOSIS — Z8639 Personal history of other endocrine, nutritional and metabolic disease: Secondary | ICD-10-CM | POA: Insufficient documentation

## 2014-12-30 DIAGNOSIS — X58XXXA Exposure to other specified factors, initial encounter: Secondary | ICD-10-CM

## 2014-12-30 DIAGNOSIS — Y998 Other external cause status: Secondary | ICD-10-CM | POA: Insufficient documentation

## 2014-12-30 DIAGNOSIS — S0031XA Abrasion of nose, initial encounter: Secondary | ICD-10-CM | POA: Insufficient documentation

## 2014-12-30 DIAGNOSIS — S52592A Other fractures of lower end of left radius, initial encounter for closed fracture: Secondary | ICD-10-CM | POA: Insufficient documentation

## 2014-12-30 NOTE — ED Notes (Signed)
Patient transported to X-ray 
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
## 2014-12-31 MED ORDER — OXYCODONE-ACETAMINOPHEN 5-325 MG PO TABS: 1.0000 | ORAL_TABLET | Freq: Four times a day (QID) | ORAL | Status: DC | PRN

## 2014-12-31 NOTE — ED Notes (Signed)
Ortho tech at bedside 

## 2014-12-31 NOTE — Discharge Instructions (Signed)
Cast or Splint Care °Casts and splints support injured limbs and keep bones from moving while they heal. It is important to care for your cast or splint at home.   °HOME CARE INSTRUCTIONS °· Keep the cast or splint uncovered during the drying period. It can take 24 to 48 hours to dry if it is made of plaster. A fiberglass cast will dry in less than 1 hour. °· Do not rest the cast on anything harder than a pillow for the first 24 hours. °· Do not put weight on your injured limb or apply pressure to the cast until your health care provider gives you permission. °· Keep the cast or splint dry. Wet casts or splints can lose their shape and may not support the limb as well. A wet cast that has lost its shape can also create harmful pressure on your skin when it dries. Also, wet skin can become infected. °¨ Cover the cast or splint with a plastic bag when bathing or when out in the rain or snow. If the cast is on the trunk of the body, take sponge baths until the cast is removed. °¨ If your cast does become wet, dry it with a towel or a blow dryer on the cool setting only. °· Keep your cast or splint clean. Soiled casts may be wiped with a moistened cloth. °· Do not place any hard or soft foreign objects under your cast or splint, such as cotton, toilet paper, lotion, or powder. °· Do not try to scratch the skin under the cast with any object. The object could get stuck inside the cast. Also, scratching could lead to an infection. If itching is a problem, use a blow dryer on a cool setting to relieve discomfort. °· Do not trim or cut your cast or remove padding from inside of it. °· Exercise all joints next to the injury that are not immobilized by the cast or splint. For example, if you have a long leg cast, exercise the hip joint and toes. If you have an arm cast or splint, exercise the shoulder, elbow, thumb, and fingers. °· Elevate your injured arm or leg on 1 or 2 pillows for the first 1 to 3 days to decrease  swelling and pain. It is best if you can comfortably elevate your cast so it is higher than your heart. °SEEK MEDICAL CARE IF:  °· Your cast or splint cracks. °· Your cast or splint is too tight or too loose. °· You have unbearable itching inside the cast. °· Your cast becomes wet or develops a soft spot or area. °· You have a bad smell coming from inside your cast. °· You get an object stuck under your cast. °· Your skin around the cast becomes red or raw. °· You have new pain or worsening pain after the cast has been applied. °SEEK IMMEDIATE MEDICAL CARE IF:  °· You have fluid leaking through the cast. °· You are unable to move your fingers or toes. °· You have discolored (blue or white), cool, painful, or very swollen fingers or toes beyond the cast. °· You have tingling or numbness around the injured area. °· You have severe pain or pressure under the cast. °· You have any difficulty with your breathing or have shortness of breath. °· You have chest pain. °Document Released: 06/26/2000 Document Revised: 04/19/2013 Document Reviewed: 01/05/2013 °[employer]® Patient Information ©2015 [employer]. This information is not intended to replace advice given to you by your health care   provider. Make sure you discuss any questions you have with your health care provider.   Radial Head Fracture A radial head fracture is a break of the smaller bone (radius) in the forearm. The head of this bone is the part near the elbow. These fractures commonly happen during a fall, when you land on an outstretched arm. These fractures are more common in middle aged adults and are common with a dislocation of the elbow. SYMPTOMS   Swelling of the elbow joint and pain on the outside of the elbow.  Pain and difficulty in bending or straightening the elbow.  Pain and difficulty in turning the palm of the hand up or down with the elbow bent. DIAGNOSIS  Your caregiver may make this diagnosis by a physical exam. X-rays can confirm  the type and amount of fracture. Sometimes a fracture that is not displaced cannot be seen on the original X-ray. TREATMENT  Radial head fractures are classified according to the amount of movement (displacement) of parts from the normal position.  Type 1 Fractures  Type 1 fractures are generally small fractures in which bone pieces remain together (nondisplaced fracture).  The fracture may not be seen on initial X-rays. Usually if X-rays are repeated two to three weeks later, the fracture will show up. A splint or sling is used for a few days. Gentle early motion is used to prevent the elbow from becoming stiff. It should not be done vigorously or forced as this could displace the bone pieces. Type 2 Fractures  With type 2 fractures, bone pieces are slightly displaced and larger pieces of bone are broken off.  If only a little displacement of the bone piece is present, splinting for 4 to 5 days usually works well. This is again followed with gentle active range of motion. Small fragments may be surgically removed.  Large pieces of bone that can be put back into place will sometimes be fixed with pins or screws to hold them until the bone is healed. If this cannot be done, the fragments are removed. For older, less active people, sometimes the entire radial head is removed if the wrist is not injured. The elbow and arm will still work fine. Soft tissue, tendon, and ligament injuries are corrected at the same time. Type 3 Fractures  Type 3 fractures have multiple broken pieces of bone that cannot be fixed. Surgery is usually needed to remove the broken bits of bone and what is left of the radial head. Soft-tissue damage is repaired. Gentle early motion is used to prevent the elbow from becoming stiff. Sometimes an artificial radial head can be used to prevent deformity if the elbow is unstable. Rest, ice, elevation, immobilization, medications, and pain control are used in the early care. HOME CARE  INSTRUCTIONS   Keep the injured part elevated while sitting or lying down. Keep the injury above the level of your heart (the center of the chest). This will decrease swelling and pain.  Apply ice to the injury for 15-20 minutes, 03-04 times per day while awake, for 2 days. Put the ice in a plastic bag and place a towel between the bag of ice and your cast or splint.  Move your fingers to avoid stiffness and minimize swelling.  If you have a plaster or fiberglass cast:  Do not try to scratch the skin under the cast using sharp or pointed objects.  Check the skin around the cast every day. You may put lotion on any red  or sore areas.  Keep your cast dry and clean.  If you have a plaster splint:  Wear the splint as directed.  You may loosen the elastic around the splint if your fingers become numb, tingle, or turn cold or blue.  Do not put pressure on any part of your cast or splint. It may break. Rest your cast only on a pillow for the first 24 hours until it is fully hardened.  Your cast or splint can be protected during bathing with a plastic bag. Do not lower the cast or splint into the water.  Only take over-the-counter or prescription medicines for pain, discomfort, or fever as directed by your caregiver.  Follow all instructions for follow-up with your caregiver. This includes any orthopedic referrals, physical therapy, and rehabilitation. Any delay in obtaining necessary care could result in a delay or failure of the bones to heal or permanent elbow stiffness.  Do not overdo exercises. This could further damage your injury. SEEK IMMEDIATE MEDICAL CARE IF:   Your cast or splint gets damaged or breaks.  You have more severe pain or swelling than you did before getting the cast.  You have severe pain when stretching your fingers.  There is a bad smell, new stains, and/or pus-like (purulent) drainage coming from under the cast.  Your fingers or hand turn pale or blue,  become cold, or you lose feeling. Document Released: 04/20/2006 Document Revised: 11/13/2013 Document Reviewed: 05/28/2009 [facility] Patient Information 2015 [place], [place]. This information is not intended to replace advice given to you by your health care provider. Make sure you discuss any questions you have with your health care provider.  Radial Fracture You have a broken bone (fracture) of the forearm. This is the part of your arm between the elbow and your wrist. Your forearm is made up of two bones. These are the radius and ulna. Your fracture is in the radial shaft. This is the bone in your forearm located on the thumb side. A cast or splint is used to protect and keep your injured bone from moving. The cast or splint will be on generally for about 5 to 6 weeks, with individual variations. HOME CARE INSTRUCTIONS   Keep the injured part elevated while sitting or lying down. Keep the injury above the level of your heart (the center of the chest). This will decrease swelling and pain.  Apply ice to the injury for 15-20 minutes, 03-04 times per day while awake, for 2 days. Put the ice in a plastic bag and place a towel between the bag of ice and your cast or splint.  Move your fingers to avoid stiffness and minimize swelling.  If you have a plaster or fiberglass cast:  Do not try to scratch the skin under the cast using sharp or pointed objects.  Check the skin around the cast every day. You may put lotion on any red or sore areas.  Keep your cast dry and clean.  If you have a plaster splint:  Wear the splint as directed.  You may loosen the elastic around the splint if your fingers become numb, tingle, or turn cold or blue.  Do not put pressure on any part of your cast or splint. It may break. Rest your cast only on a pillow for the first 24 hours until it is fully hardened.  Your cast or splint can be protected during bathing with a plastic bag. Do not lower the cast or splint  into water.  Only take over-the-counter or prescription medicines for pain, discomfort, or fever as directed by your caregiver. SEEK IMMEDIATE MEDICAL CARE IF:   Your cast gets damaged or breaks.  You have more severe pain or swelling than you did before getting the cast.  You have severe pain when stretching your fingers.  There is a bad smell, new stains and/or pus-like (purulent) drainage coming from under the cast.  Your fingers or hand turn pale or blue and become cold or your loose feeling. Document Released: 12/10/2005 Document Revised: 09/21/2011 Document Reviewed: 03/08/2006 [facility] Patient Information 2015 [place], [place]. This information is not intended to replace advice given to you by your health care provider. Make sure you discuss any questions you have with your health care provider.
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
## 2019-02-13 ENCOUNTER — Encounter (HOSPITAL_COMMUNITY): Payer: Self-pay | Admitting: *Deleted

## 2019-02-13 ENCOUNTER — Other Ambulatory Visit: Payer: Self-pay

## 2019-02-13 ENCOUNTER — Inpatient Hospital Stay (HOSPITAL_COMMUNITY)
Admission: AD | Admit: 2019-02-13 | Discharge: 2019-02-13 | Disposition: A | Discharge status: Home / Self Care | Payer: BLUE CROSS/BLUE SHIELD | Attending: Obstetrics and Gynecology | Admitting: Obstetrics and Gynecology

## 2019-02-13 ENCOUNTER — Inpatient Hospital Stay (HOSPITAL_COMMUNITY): Payer: BLUE CROSS/BLUE SHIELD

## 2019-02-13 DIAGNOSIS — R109 Unspecified abdominal pain: Secondary | ICD-10-CM | POA: Diagnosis present

## 2019-02-13 DIAGNOSIS — O468X9 Other antepartum hemorrhage, unspecified trimester: Secondary | ICD-10-CM | POA: Diagnosis present

## 2019-02-13 DIAGNOSIS — Z3A01 Less than 8 weeks gestation of pregnancy: Secondary | ICD-10-CM | POA: Diagnosis not present

## 2019-02-13 DIAGNOSIS — O418X90 Other specified disorders of amniotic fluid and membranes, unspecified trimester, not applicable or unspecified: Secondary | ICD-10-CM | POA: Diagnosis present

## 2019-02-13 DIAGNOSIS — Z87891 Personal history of nicotine dependence: Secondary | ICD-10-CM | POA: Insufficient documentation

## 2019-02-13 DIAGNOSIS — O209 Hemorrhage in early pregnancy, unspecified: Secondary | ICD-10-CM | POA: Diagnosis present

## 2019-02-13 DIAGNOSIS — O468X1 Other antepartum hemorrhage, first trimester: Secondary | ICD-10-CM

## 2019-02-13 DIAGNOSIS — O4690 Antepartum hemorrhage, unspecified, unspecified trimester: Secondary | ICD-10-CM

## 2019-02-13 DIAGNOSIS — O418X12 Other specified disorders of amniotic fluid and membranes, first trimester, fetus 2: Secondary | ICD-10-CM

## 2019-02-13 DIAGNOSIS — O30041 Twin pregnancy, dichorionic/diamniotic, first trimester: Secondary | ICD-10-CM

## 2019-02-13 DIAGNOSIS — O200 Threatened abortion: Secondary | ICD-10-CM | POA: Insufficient documentation

## 2019-02-13 DIAGNOSIS — O26899 Other specified pregnancy related conditions, unspecified trimester: Secondary | ICD-10-CM | POA: Diagnosis present

## 2019-02-13 HISTORY — DX: Twin pregnancy, dichorionic/diamniotic, first trimester: O30.041

## 2019-02-13 LAB — URINALYSIS, ROUTINE W REFLEX MICROSCOPIC
Bilirubin Urine: NEGATIVE
Glucose, UA: NEGATIVE mg/dL
Ketones, ur: NEGATIVE mg/dL
Leukocytes,Ua: NEGATIVE
Nitrite: NEGATIVE
Protein, ur: 30 mg/dL — AB
RBC / HPF: >50 RBC/hpf — ABNORMAL HIGH (ref 0–5)
Specific Gravity, Urine: 1.025 (ref 1.005–1.030)
pH: 6 (ref 5.0–8.0)

## 2019-02-13 LAB — CBC
HCT: 37.2 % (ref 36.0–46.0)
Hemoglobin: 12.8 g/dL (ref 12.0–15.0)
MCH: 31.2 pg (ref 26.0–34.0)
MCHC: 34.4 g/dL (ref 30.0–36.0)
MCV: 90.7 fL (ref 80.0–100.0)
Platelets: 211 10*3/uL (ref 150–400)
RBC: 4.1 MIL/uL (ref 3.87–5.11)
RDW: 11.7 % (ref 11.5–15.5)
WBC: 7 10*3/uL (ref 4.0–10.5)
nRBC: 0 % (ref 0.0–0.2)

## 2019-02-13 LAB — POCT PREGNANCY, URINE: Preg Test, Ur: POSITIVE — AB

## 2019-02-13 LAB — WET PREP, GENITAL
Clue Cells Wet Prep HPF POC: NONE SEEN
Sperm: NONE SEEN
Trich, Wet Prep: NONE SEEN
WBC, Wet Prep HPF POC: NONE SEEN
Yeast Wet Prep HPF POC: NONE SEEN

## 2019-02-13 LAB — HCG, QUANTITATIVE, PREGNANCY: hCG, Beta Chain, Quant, S: 106414 m[IU]/mL — ABNORMAL HIGH (ref <5)

## 2019-02-13 NOTE — MAU Note (Signed)
Cramping and bleeding, started about 56min.  Is about 6 wks preg.  +HPT on 6/24.  Has not been seen for preg.

## 2019-02-13 NOTE — Discharge Instructions (Signed)
Return to MAU:  If you have heavier bleeding that soaks through more that 2 pads per hour for an hour or more  If you bleed so much that you feel like you might pass out or you do pass out  If you have significant abdominal pain that is not improved with Tylenol   If you develop a fever > 100.5   Safe Medications in Pregnancy   Acne: Benzoyl Peroxide Salicylic Acid  Backache/Headache: Tylenol: 2 regular strength every 4 hours OR              2 Extra strength every 6 hours  Colds/Coughs/Allergies: Benadryl (alcohol free) 25 mg every 6 hours as needed Breath right strips Claritin Cepacol throat lozenges Chloraseptic throat spray Cold-Eeze- up to three times per day Cough drops, alcohol free Flonase (by prescription only) Guaifenesin Mucinex Robitussin DM (plain only, alcohol free) Saline nasal spray/drops Sudafed (pseudoephedrine) & Actifed ** use only after [redacted] weeks gestation and if you do not have high blood pressure Tylenol Vicks Vaporub Zinc lozenges Zyrtec   Constipation: Colace Ducolax suppositories Fleet enema Glycerin suppositories Metamucil Milk of magnesia Miralax Senokot Smooth move tea  Diarrhea: Kaopectate Imodium A-D  *NO pepto Bismol  Hemorrhoids: Anusol Anusol HC Preparation H Tucks  Indigestion: Tums Maalox Mylanta Zantac  Pepcid  Insomnia: Benadryl (alcohol free) 25mg  every 6 hours as needed Tylenol PM Unisom, no Gelcaps  Leg Cramps: Tums MagGel  Nausea/Vomiting:  Bonine Dramamine Emetrol Ginger extract Sea bands Meclizine  Nausea medication to take during pregnancy:  Unisom (doxylamine succinate 25 mg tablets) Take one tablet daily at bedtime. If symptoms are not adequately controlled, the dose can be increased to a maximum recommended dose of two tablets daily (1/2 tablet in the morning, 1/2 tablet mid-afternoon and one at bedtime). Vitamin B6 100mg  tablets. Take one tablet twice a day (up to 200 mg per  day).  Skin Rashes: Aveeno products Benadryl cream or 25mg  every 6 hours as needed Calamine Lotion 1% cortisone cream  Yeast infection: Gyne-lotrimin 7 Monistat 7   **If taking multiple medications, please check labels to avoid duplicating the same active ingredients **take medication as directed on the label ** Do not exceed 4000 mg of tylenol in 24 hours **Do not take medications that contain aspirin or ibuprofen

## 2019-02-13 NOTE — MAU Note (Signed)
Pt stated she started bleeding around 11 am, off and on cramping.

## 2019-02-13 NOTE — MAU Provider Note (Signed)
History     CSN: [id number]  Arrival date and time: 02/13/19 1112   First Provider Initiated Contact with Patient 02/13/19 1442      Chief Complaint  Patient presents with  . Vaginal Bleeding  . Abdominal Pain   HPI  Ms.  Jillian Walter is a 33 y.o. year old G1P0 female at 2662w1d weeks gestation who presents to MAU reporting (+) HPT on 87/24/20, abdominal pain/cramping, intermittent VB that started @ 1100 this AM. She reports last SI was 02/12/19; no complications. She denies any N/V or any other associated symptoms. She has not taken any medication for the pain or bleeding. She plans to establish care with an OB/GYN with [facility]; NOB & U/S appts are scheduled for 02/20/19. *Spouse present and participating in hx taking.  Past Medical History:  Diagnosis Date  . Polycystic disease, ovaries     Past Surgical History:  Procedure Laterality Date  . extraction of wisdom teeth      Family History  Problem Relation Age of Onset  . Hypertension Mother   . Diabetes Other   . Hypertension Other     Social History   Tobacco Use  . Smoking status: Former Smoker    Packs/day: 0.50  . Smokeless tobacco: Former Engineer, waterUser  Substance Use Topics  . Alcohol use: Yes    Comment: occasional  . Drug use: No    Allergies: No Known Allergies  No medications prior to admission.    Review of Systems  Constitutional: Negative.   HENT: Negative.   Eyes: Negative.   Respiratory: Negative.   Cardiovascular: Negative.   Gastrointestinal: Negative.   Endocrine: Negative.   Genitourinary: Positive for pelvic pain (L>R) and vaginal bleeding.  Musculoskeletal: Negative.   Skin: Negative.   Allergic/Immunologic: Negative.   Neurological: Negative.   Hematological: Negative.   Psychiatric/Behavioral: Negative.    Physical Exam   Blood pressure 133/76, pulse 85, temperature 98.8 F (37.1 C), temperature source Oral, resp. rate 20, height 5\' 4"  (1.626 m), weight 78.7  kg, last menstrual period 01/01/2019, SpO2 99 %.  Physical Exam  Nursing note and vitals reviewed. Constitutional: She is oriented to person, place, and time. She appears well-developed and well-nourished.  HENT:  Head: Normocephalic and atraumatic.  Eyes: Pupils are equal, round, and reactive to light.  Neck: Normal range of motion.  Cardiovascular: Normal rate.  Respiratory: Effort normal.  GI: Soft.  Genitourinary:    Genitourinary Comments: Uterus: mildly tender, SE: cervix is smooth, pink, no lesions, moderate amt of dark, red blood in vaginal vault -- WP, GC/CT done, closed/long/firm, no CMT or friability, no adnexal tenderness    Musculoskeletal: Normal range of motion.  Neurological: She is alert and oriented to person, place, and time.  Skin: Skin is warm and dry.  Psychiatric: She has a normal mood and affect. Her behavior is normal. Judgment and thought content normal.    MAU Course  Procedures  MDM CCUA UPT CBC ABO/Rh HCG Wet Prep GC/CT -- pending HIV -- pending OB < 14 wks US with TV  Results for orders placed or performed during the hospital encounter of 02/13/19 (from the past 24 hour(s))  Pregnancy, urine POC     Status: Abnormal   Collection Time: 02/13/19 12:00 PM  Result Value Ref Range   Preg Test, Ur POSITIVE (A) NEGATIVE  Urinalysis, Routine w reflex microscopic     Status: Abnormal   Collection Time: 02/13/19 12:03 PM  Result  Value Ref Range   Color, Urine YELLOW YELLOW   APPearance HAZY (A) CLEAR   Specific Gravity, Urine 1.025 1.005 - 1.030   pH 6.0 5.0 - 8.0   Glucose, UA NEGATIVE NEGATIVE mg/dL   Hgb urine dipstick LARGE (A) NEGATIVE   Bilirubin Urine NEGATIVE NEGATIVE   Ketones, ur NEGATIVE NEGATIVE mg/dL   Protein, ur 30 (A) NEGATIVE mg/dL   Nitrite NEGATIVE NEGATIVE   Leukocytes,Ua NEGATIVE NEGATIVE   RBC / HPF >50 (H) 0 - 5 RBC/hpf   WBC, UA 0-5 0 - 5 WBC/hpf   Bacteria, UA RARE (A) NONE SEEN   Squamous Epithelial / LPF 0-5 0 - 5    Mucus PRESENT   CBC     Status: None   Collection Time: 02/13/19  1:55 PM  Result Value Ref Range   WBC 7.0 4.0 - 10.5 K/uL   RBC 4.10 3.87 - 5.11 MIL/uL   Hemoglobin 12.8 12.0 - 15.0 g/dL   HCT 37.2 36.0 - 46.0 %   MCV 90.7 80.0 - 100.0 fL   MCH 31.2 26.0 - 34.0 pg   MCHC 34.4 30.0 - 36.0 g/dL   RDW 11.7 11.5 - 15.5 %   Platelets 211 150 - 400 K/uL   nRBC 0.0 0.0 - 0.2 %  ABO/Rh     Status: None   Collection Time: 02/13/19  1:55 PM  Result Value Ref Range   ABO/RH(D)      B POS Performed at [facility], [address], [place], [place] [address]   hCG, quantitative, pregnancy     Status: Abnormal   Collection Time: 02/13/19  1:55 PM  Result Value Ref Range   hCG, Beta Chain, Quant, S 106,414 (H) <5 mIU/mL  Wet prep, genital     Status: None   Collection Time: 02/13/19  3:10 PM   Specimen: Vaginal  Result Value Ref Range   Yeast Wet Prep HPF POC NONE SEEN NONE SEEN   Trich, Wet Prep NONE SEEN NONE SEEN   Clue Cells Wet Prep HPF POC NONE SEEN NONE SEEN   WBC, Wet Prep HPF POC NONE SEEN NONE SEEN   Sperm NONE SEEN     US Ob Comp Addl Gest Less 14 Wks  Result Date: 02/13/2019 CLINICAL DATA:  Vaginal bleeding and cramping beginning today. Gestational age by LMP of 6 weeks 1 day. EXAM: TWIN OBSTETRIC <14WK [place] AND TRANSVAGINAL OB US COMPARISON:  None. FINDINGS: Number of IUPs:  2 Chorionicity/Amnionicity:  Dichorionic-diamniotic (thick membrane) TWIN 1 Yolk sac:  Not Visualized. Embryo:  Visualized. Cardiac Activity: Visualized. Heart Rate: 70 bpm CRL:  5 mm   6 w 1 d                  [place] EDC: 10/08/2019 TWIN 2 Yolk sac:  Visualized. Embryo:  Visualized. Cardiac Activity: Visualized. Heart Rate: 96 bpm CRL:  3 mm   5 w 5 d                  [place] EDC: 10/11/2019 Subchorionic hemorrhage:  Moderate subchorionic hemorrhage is noted. Maternal uterus/adnexae: Both ovaries are normal in appearance. No mass or abnormal free fluid identified. IMPRESSION: Living dichorionic twin IUP, with  estimated gestational age of [redacted] weeks 0 days, and [place] EDC of 10/09/2019. Embryonic bradycardia and moderate subchorionic hemorrhage noted, which are poor prognostic signs. Suggest correlation with serial b-hCG levels, and consider followup ultrasound to assess viability in 7 days. Electronically Signed  By: Danae OrleansJohn A Stahl M.D.   On: 02/13/2019 15:40    Assessment and Plan  Dichorionic diamniotic twin pregnancy in first trimester  - Discussed twin pregnancy is high risk - Information provided on multiple pregnancies   Subchorionic hematoma in first trimester, fetus 2 of multiple gestation - Discussed this is cause of VB - Advised that this increases the risk of miscarriage - Advised that VB may continue - Placed on pelvic rest - advised to have SI until VB stops and instructed by provider - Information provided on [facility]   Threatened miscarriage in early pregnancy - Return to MAU:   If you have heavier bleeding that soaks through more that 2 pads per hour for an hour or more  If you bleed so much that you feel like you might pass out or you do pass out  If you have significant abdominal pain that is not improved with Tylenol   If you develop a fever > 100.5   Abdominal pain affecting pregnancy  - Information provided on abdominal pain in pregnancy - Advised to take Tylenol 1000  Mg every 6 hours prn pain   - Discharge patient - Keep NOB & U/S appts scheduled with OB/GYN in [place]  - Patient verbalized an understanding of the plan of care and agrees.   Raelyn Moraolitta Keyanah Kozicki, MSN, CNM 02/13/2019, 2:43 PM

## 2019-02-13 NOTE — ED Triage Notes (Signed)
Called MAU and transport 

## 2019-02-13 NOTE — ED Triage Notes (Signed)
Pt. Stated, Jillian Walter had a positive pregnancy test at home. Im cramping and bleeding since 10-15 min ago.

## 2019-02-14 LAB — HIV ANTIBODY (ROUTINE TESTING W REFLEX): HIV Screen 4th Generation wRfx: NONREACTIVE

## 2019-02-14 LAB — ABO/RH: ABO/RH(D): B POS

## 2019-02-14 LAB — GC/CHLAMYDIA PROBE AMP (~~LOC~~) NOT AT ARMC
Chlamydia: NEGATIVE
Neisseria Gonorrhea: NEGATIVE

## 2019-03-20 ENCOUNTER — Other Ambulatory Visit: Payer: Self-pay

## 2019-03-20 ENCOUNTER — Inpatient Hospital Stay (HOSPITAL_COMMUNITY): Payer: BLUE CROSS/BLUE SHIELD

## 2019-03-20 ENCOUNTER — Encounter (HOSPITAL_COMMUNITY): Payer: Self-pay

## 2019-03-20 ENCOUNTER — Inpatient Hospital Stay (HOSPITAL_COMMUNITY)
Admission: AD | Admit: 2019-03-20 | Discharge: 2019-03-20 | Disposition: A | Discharge status: Home / Self Care | Payer: BLUE CROSS/BLUE SHIELD | Attending: Obstetrics and Gynecology | Admitting: Obstetrics and Gynecology

## 2019-03-20 DIAGNOSIS — O26899 Other specified pregnancy related conditions, unspecified trimester: Secondary | ICD-10-CM

## 2019-03-20 DIAGNOSIS — O418X12 Other specified disorders of amniotic fluid and membranes, first trimester, fetus 2: Secondary | ICD-10-CM | POA: Diagnosis not present

## 2019-03-20 DIAGNOSIS — O26891 Other specified pregnancy related conditions, first trimester: Secondary | ICD-10-CM

## 2019-03-20 DIAGNOSIS — Z8249 Family history of ischemic heart disease and other diseases of the circulatory system: Secondary | ICD-10-CM | POA: Diagnosis not present

## 2019-03-20 DIAGNOSIS — Z3A11 11 weeks gestation of pregnancy: Secondary | ICD-10-CM

## 2019-03-20 DIAGNOSIS — O3111X2 Continuing pregnancy after spontaneous abortion of one fetus or more, first trimester, fetus 2: Secondary | ICD-10-CM | POA: Insufficient documentation

## 2019-03-20 DIAGNOSIS — O30001 Twin pregnancy, unspecified number of placenta and unspecified number of amniotic sacs, first trimester: Secondary | ICD-10-CM | POA: Insufficient documentation

## 2019-03-20 DIAGNOSIS — Z87891 Personal history of nicotine dependence: Secondary | ICD-10-CM | POA: Insufficient documentation

## 2019-03-20 DIAGNOSIS — O209 Hemorrhage in early pregnancy, unspecified: Secondary | ICD-10-CM

## 2019-03-20 DIAGNOSIS — O468X1 Other antepartum hemorrhage, first trimester: Secondary | ICD-10-CM

## 2019-03-20 DIAGNOSIS — O09511 Supervision of elderly primigravida, first trimester: Secondary | ICD-10-CM | POA: Diagnosis not present

## 2019-03-20 DIAGNOSIS — O30101 Triplet pregnancy, unspecified number of placenta and unspecified number of amniotic sacs, first trimester: Secondary | ICD-10-CM | POA: Diagnosis not present

## 2019-03-20 DIAGNOSIS — N939 Abnormal uterine and vaginal bleeding, unspecified: Secondary | ICD-10-CM | POA: Diagnosis present

## 2019-03-20 DIAGNOSIS — O208 Other hemorrhage in early pregnancy: Secondary | ICD-10-CM | POA: Diagnosis not present

## 2019-03-20 DIAGNOSIS — O30041 Twin pregnancy, dichorionic/diamniotic, first trimester: Secondary | ICD-10-CM

## 2019-03-20 DIAGNOSIS — O418X11 Other specified disorders of amniotic fluid and membranes, first trimester, fetus 1: Secondary | ICD-10-CM

## 2019-03-20 DIAGNOSIS — R109 Unspecified abdominal pain: Secondary | ICD-10-CM

## 2019-03-20 LAB — URINALYSIS, ROUTINE W REFLEX MICROSCOPIC
Bacteria, UA: NONE SEEN
Bilirubin Urine: NEGATIVE
Glucose, UA: NEGATIVE mg/dL
Hgb urine dipstick: NEGATIVE
Ketones, ur: 20 mg/dL — AB
Leukocytes,Ua: NEGATIVE
Nitrite: NEGATIVE
Protein, ur: 30 mg/dL — AB
Specific Gravity, Urine: 1.025 (ref 1.005–1.030)
pH: 6 (ref 5.0–8.0)

## 2019-03-20 NOTE — MAU Provider Note (Signed)
History     CSN: [id number]  Arrival date and time: 03/20/19 1045   First Provider Initiated Contact with Patient 03/20/19 1207      Chief Complaint  Patient presents with  . Vaginal Bleeding  . Abdominal Pain   HPI Jillian Walter 33 y.o.  Has been seen previously in MAU on 02-13-19 and note and Ultrasound was reviewed.  Client of MD at [facility].  Has been seen in the office twice.  Also was sent to [facility] and has had 2 ultrasounds on 02-24-19 and 03-08-19.  Pregnancy was originally a triplett trichorionic, triambrionic pregnancy with one sac with no fetus, one sac with a nonliving fetus and one living fetus with EDC of 10-11-18.  Client has had periodic bleeding during the pregnancy and noted on ultrasound was also a moderate sugbchorionic hemorrhage.  She has not been worried about the bleeding however last night had a larger amount of bleeding that went through her clothing and onto the mattress where she was sitting.    Also she is having some back, side and abdominal pain.  She has a low level of pain that is constant and then periodic sharp pains that cause her to stop coming from the right side of her back to her right side and radiate down to the midline pubis.  [place] in [place] so she came here today to check on the pregnancy due to the vaginal bleeding and an increase in the shooting pains.  She is not able to sit as it causes more pain.  She is lying flat and standing and can move between positions.  [facility] the [facility] she was there for over 8 hours.    OB History    Gravida  1   Para      Term      Preterm      AB      Living        SAB      TAB      Ectopic      Multiple      Live Births              Past Medical History:  Diagnosis Date  . Polycystic disease, ovaries     Past Surgical History:  Procedure Laterality Date  . extraction of wisdom  teeth      Family History  Problem Relation Age of Onset  . Hypertension Mother   . Diabetes Other   . Hypertension Other     Social History   Tobacco Use  . Smoking status: Former Smoker    Packs/day: 0.50  . Smokeless tobacco: Former Engineer, waterUser  Substance Use Topics  . Alcohol use: Yes    Comment: occasional  . Drug use: No    Allergies:  Allergies  Allergen Reactions  . Latex Itching    Medications Prior to Admission  Medication Sig Dispense Refill Last Dose  . albuterol (PROVENTIL HFA;VENTOLIN HFA) 108 (90 BASE) MCG/ACT inhaler Inhale 2 puffs into the lungs every 6 (six) hours as needed for wheezing or shortness of breath.     . benzonatate (TESSALON) 100 MG capsule Take 1 capsule (100 mg total) by mouth every 8 (eight) hours. 21 capsule 0   . Prenatal Vit-Fe Fumarate-FA (PRENATAL MULTIVITAMIN) TABS Take 1 tablet by mouth daily.     . sodium chloride (OCEAN)  0.65 % SOLN nasal spray Place 2 sprays into both nostrils as needed for congestion. (Patient not taking: Reported on 12/30/2014) 1 Bottle 0     Review of Systems  Constitutional: Negative for fever.  Respiratory: Negative for cough and shortness of breath.   Gastrointestinal: Positive for abdominal pain. Negative for diarrhea, nausea and vomiting.  Genitourinary: Positive for vaginal bleeding. Negative for dysuria and vaginal discharge.  Neurological: Negative for headaches.   Physical Exam   Blood pressure 122/72, pulse 95, temperature 98.6 F (37 C), temperature source Oral, resp. rate 18, weight 79.4 kg, last menstrual period 01/01/2019, SpO2 100 %.  Physical Exam  Nursing note and vitals reviewed. Constitutional: She is oriented to person, place, and time. She appears well-developed and well-nourished.  Standing at bedside.  Cannot sit due to increased pain.  HENT:  Head: Normocephalic.  Eyes: EOM are normal.  Neck: Neck supple.  Cardiovascular: Normal rate.  Respiratory: Breath sounds normal.  GI: Soft.  There is no abdominal tenderness. There is no rebound and no guarding.  Genitourinary:    Genitourinary Comments: Pelvic deferred   Musculoskeletal: Normal range of motion.  Neurological: She is alert and oriented to person, place, and time.  Skin: Skin is warm and dry.  Psychiatric: She has a normal mood and affect.    MAU Course  Procedures  Labs Results for orders placed or performed during the hospital encounter of 03/20/19 (from the past 24 hour(s))  Urinalysis, Routine w reflex microscopic     Status: Abnormal   Collection Time: 03/20/19 11:38 AM  Result Value Ref Range   Color, Urine AMBER (A) YELLOW   APPearance HAZY (A) CLEAR   Specific Gravity, Urine 1.025 1.005 - 1.030   pH 6.0 5.0 - 8.0   Glucose, UA NEGATIVE NEGATIVE mg/dL   Hgb urine dipstick NEGATIVE NEGATIVE   Bilirubin Urine NEGATIVE NEGATIVE   Ketones, ur 20 (A) NEGATIVE mg/dL   Protein, ur 30 (A) NEGATIVE mg/dL   Nitrite NEGATIVE NEGATIVE   Leukocytes,Ua NEGATIVE NEGATIVE   RBC / HPF 0-5 0 - 5 RBC/hpf   WBC, UA 0-5 0 - 5 WBC/hpf   Bacteria, UA NONE SEEN NONE SEEN   Squamous Epithelial / LPF 0-5 0 - 5   Mucus PRESENT      MDM CLINICAL DATA:  Vaginal bleeding in pregnancy.  First trimester.  EXAM: TWIN OBSTETRIC <14WK [place] AND TRANSVAGINAL OB US  COMPARISON:  None.  FINDINGS: Number of IUPs:  2  Chorionicity/Amnionicity:  Dichorionic-diamniotic (thick membrane)  TWIN 1  Yolk sac:  Not Visualized.  Embryo:  Visualized.  Cardiac Activity: Visualized  Heart Rate: 161 bpm  MSD:   mm    w     d  CRL:  44.5 mm   11 w 1 d                  [place] EDC: 10/08/2019  TWIN 2  Yolk sac:  Visualized.  Embryo:  Not Visualized.  MSD: 33.5 mm   8 w   5 d  Subchorionic hemorrhage:  Large subchorionic hemorrhage  Maternal uterus/adnexae:  Right ovary: Normal  Left ovary: Normal  Other :None  Free fluid:  None  IMPRESSION: 1. Twin #2 is no longer visualized with  persistence of gestational sac and yolk sac. Findings meet definitive criteria for failed pregnancy. This follows SRU consensus guidelines: Diagnostic Criteria for Nonviable Pregnancy Early in the First Trimester. Alison Stalling J Med [phone]. 2. Twin #1 remains  viable with fetal heart rate of 161 beats per minute and an estimated gestational age of [redacted] weeks and 1 day. 3. Large subchorionic hemorrhage noted.  discussed ultrasound results with client.  One viable fetus and now a large subchorionic hemorrhage.  Discussed that additional bleeding may occur and that subchorionic hemorrhage may be contributing to her cramping.  The pregnancy is progressing well but the large subchorionic hemorrhage is concerning.  See your provider soon expecially if you continue to have more abdominal pain.  Assessment and Plan  One viable fetus at [redacted]w[redacted]d and large subchorionic hemorrhage  Plan Follow up with your provider in [facility] Take Tylenol 325 mg 2 tablets by mouth every 4 hours if needed for pain. Drink at least 8 8-oz glasses of water every day. Expect to have more vaginal bleeding.  Noella Kipnis L Zephaniah Lubrano 03/20/2019, 12:30 PM

## 2019-03-20 NOTE — MAU Note (Signed)
States she has a triplet pregnancy and she found out that C was a sac and B had a heartbeat that was bradycardia. Baby A everything looked normal, FHR at 152

## 2019-03-20 NOTE — Discharge Instructions (Signed)
Follow up with your provider this week if you continue to have pain. Take Tylenol 325 mg 2 tablets by mouth every 4 hours if needed for pain. Drink at least 8 8-oz glasses of water every day.

## 2019-03-20 NOTE — MAU Note (Signed)
Last night she started bleeding really really bad. (0200), this morning when she stood up she had a sharp pain- took her to her knees.  bleeding has stopped

## 2019-06-04 ENCOUNTER — Other Ambulatory Visit: Payer: Self-pay

## 2019-06-04 ENCOUNTER — Inpatient Hospital Stay (HOSPITAL_COMMUNITY): Payer: BLUE CROSS/BLUE SHIELD

## 2019-06-04 ENCOUNTER — Encounter (HOSPITAL_COMMUNITY): Payer: Self-pay

## 2019-06-04 ENCOUNTER — Inpatient Hospital Stay (HOSPITAL_COMMUNITY)
Admission: AD | Admit: 2019-06-04 | Discharge: 2019-06-05 | Disposition: A | Discharge status: Home / Self Care | Payer: BLUE CROSS/BLUE SHIELD | Source: Ambulatory Visit | Attending: Obstetrics & Gynecology | Admitting: Obstetrics & Gynecology

## 2019-06-04 DIAGNOSIS — W19XXXA Unspecified fall, initial encounter: Secondary | ICD-10-CM | POA: Diagnosis not present

## 2019-06-04 DIAGNOSIS — R102 Pelvic and perineal pain: Secondary | ICD-10-CM | POA: Insufficient documentation

## 2019-06-04 DIAGNOSIS — O9A212 Injury, poisoning and certain other consequences of external causes complicating pregnancy, second trimester: Secondary | ICD-10-CM | POA: Diagnosis not present

## 2019-06-04 DIAGNOSIS — W182XXA Fall in (into) shower or empty bathtub, initial encounter: Secondary | ICD-10-CM | POA: Diagnosis not present

## 2019-06-04 DIAGNOSIS — O99282 Endocrine, nutritional and metabolic diseases complicating pregnancy, second trimester: Secondary | ICD-10-CM | POA: Insufficient documentation

## 2019-06-04 DIAGNOSIS — E282 Polycystic ovarian syndrome: Secondary | ICD-10-CM | POA: Insufficient documentation

## 2019-06-04 DIAGNOSIS — Z79899 Other long term (current) drug therapy: Secondary | ICD-10-CM | POA: Insufficient documentation

## 2019-06-04 DIAGNOSIS — Y93E1 Activity, personal bathing and showering: Secondary | ICD-10-CM | POA: Diagnosis not present

## 2019-06-04 DIAGNOSIS — Z3A22 22 weeks gestation of pregnancy: Secondary | ICD-10-CM | POA: Diagnosis not present

## 2019-06-04 DIAGNOSIS — Y92009 Unspecified place in unspecified non-institutional (private) residence as the place of occurrence of the external cause: Secondary | ICD-10-CM

## 2019-06-04 DIAGNOSIS — Z87891 Personal history of nicotine dependence: Secondary | ICD-10-CM | POA: Insufficient documentation

## 2019-06-04 DIAGNOSIS — O26892 Other specified pregnancy related conditions, second trimester: Secondary | ICD-10-CM | POA: Insufficient documentation

## 2019-06-04 LAB — URINALYSIS, ROUTINE W REFLEX MICROSCOPIC
Bilirubin Urine: NEGATIVE
Glucose, UA: NEGATIVE mg/dL
Hgb urine dipstick: NEGATIVE
Ketones, ur: 5 mg/dL — AB
Leukocytes,Ua: NEGATIVE
Nitrite: NEGATIVE
Protein, ur: NEGATIVE mg/dL
Specific Gravity, Urine: 1.008 (ref 1.005–1.030)
pH: 6 (ref 5.0–8.0)

## 2019-06-04 MED ORDER — CYCLOBENZAPRINE HCL 10 MG PO TABS
10.0000 mg | ORAL_TABLET | Freq: Once | ORAL | Status: AC
  Administered 2019-06-04: 10 mg via ORAL
  Filled 2019-06-04: qty 1

## 2019-06-04 NOTE — MAU Provider Note (Signed)
History     CSN: [id number]  Arrival date and time: 06/04/19 2215   First Provider Initiated Contact with Patient 06/04/19 2317      Chief Complaint  Patient presents with  . Pelvic Pain  . Fall   Jillian Walter is a 33 y.o. G1P0 at [redacted]w[redacted]d who receives care at [facility].  She presents today for Pelvic Pain and Fall.  She states she fell getting out of the shower which required her stepping out of a tub.  She states she does not recall hitting her abdomen, but remembers falling on her leg causing them to split.  She describes the pain as a "cramp type soreness" and rates it a  8-9/10 that is intermittent and worsened with movement and improved with sitting up. She denies vaginal bleeding and has not experienced movement all day.  However, she reports that she has not really experienced daily movement and "really just felt it yesterday."        OB History    Gravida  1   Para      Term      Preterm      AB      Living        SAB      TAB      Ectopic      Multiple      Live Births              Past Medical History:  Diagnosis Date  . Polycystic disease, ovaries     Past Surgical History:  Procedure Laterality Date  . extraction of wisdom teeth      Family History  Problem Relation Age of Onset  . Hypertension Mother   . Diabetes Other   . Hypertension Other     Social History   Tobacco Use  . Smoking status: Former Smoker    Packs/day: 0.50  . Smokeless tobacco: Former Network engineer Use Topics  . Alcohol use: Not Currently  . Drug use: No    Allergies:  Allergies  Allergen Reactions  . Latex Itching    Medications Prior to Admission  Medication Sig Dispense Refill Last Dose  . acetaminophen (TYLENOL) 500 MG tablet Take 500 mg by mouth every 6 (six) hours as needed.   06/04/2019 at 2030  . albuterol (PROVENTIL HFA;VENTOLIN HFA) 108 (90 BASE) MCG/ACT inhaler Inhale 2 puffs into the lungs every 6 (six) hours as needed for  wheezing or shortness of breath.     . benzonatate (TESSALON) 100 MG capsule Take 1 capsule (100 mg total) by mouth every 8 (eight) hours. 21 capsule 0   . Prenatal Vit-Fe Fumarate-FA (PRENATAL MULTIVITAMIN) TABS Take 1 tablet by mouth daily.     . sodium chloride (OCEAN) 0.65 % SOLN nasal spray Place 2 sprays into both nostrils as needed for congestion. (Patient not taking: Reported on 12/30/2014) 1 Bottle 0     Review of Systems  Constitutional: Negative for chills and fever.  Respiratory: Negative for cough and shortness of breath.   Gastrointestinal: Negative for abdominal pain, constipation, diarrhea, nausea and vomiting.  Genitourinary: Positive for pelvic pain. Negative for difficulty urinating, dysuria, vaginal bleeding and vaginal discharge.  Neurological: Negative for dizziness, light-headedness and headaches.   Physical Exam   Blood pressure 126/70, pulse 94, temperature 99.1 F (37.3 C), temperature source Oral, resp. rate 17, height 5\' 4"  (1.626 m), weight 85.5 kg, last menstrual period 01/01/2019, SpO2 98 %.  Physical  Exam  Constitutional: She is oriented to person, place, and time. She appears well-developed and well-nourished.  HENT:  Head: Normocephalic and atraumatic.  Eyes: Conjunctivae are normal.  Cardiovascular: Normal rate, regular rhythm and normal heart sounds.  Respiratory: Effort normal and breath sounds normal.  GI: Soft. Bowel sounds are normal. There is no abdominal tenderness.  Gravid--fundal height appears AGA, Soft, NT   Musculoskeletal: Normal range of motion.  Neurological: She is oriented to person, place, and time.  Skin: Skin is warm and dry.  Psychiatric: She has a normal mood and affect. Her behavior is normal.    MAU Course  Procedures Results for orders placed or performed during the hospital encounter of 06/04/19 (from the past 24 hour(s))  Urinalysis, Routine w reflex microscopic     Status: Abnormal   Collection Time: 06/04/19 10:44 PM   Result Value Ref Range   Color, Urine YELLOW YELLOW   APPearance CLEAR CLEAR   Specific Gravity, Urine 1.008 1.005 - 1.030   pH 6.0 5.0 - 8.0   Glucose, UA NEGATIVE NEGATIVE mg/dL   Hgb urine dipstick NEGATIVE NEGATIVE   Bilirubin Urine NEGATIVE NEGATIVE   Ketones, ur 5 (A) NEGATIVE mg/dL   Protein, ur NEGATIVE NEGATIVE mg/dL   Nitrite NEGATIVE NEGATIVE   Leukocytes,Ua NEGATIVE NEGATIVE    MDM Physical Exam Pain Medication Ultrasound  Assessment and Plan  33 year old, G1P0  SIUP at 68 weeks S/P Fall  -Reviewed POC with patient. -Exam performed and findings discussed.  -Educated on what to expect after a fall including muscle aches, pain, and stiffness. -Reviewed usage of tylenol to decrease discomfort. -Informed that symptoms would resolve, but would take days to weeks or possibly longer. -Reviewed non-pharmacologic methods for symptom relief including heating pads, cold compresses, and resting. -Discussed stretching techniques to improve symptoms. -Offered and accepts pain medication. -Informed that formal ultrasound would be performed to assess fetal well-being.   -Patient without questions or concerns. -Will reassess after completion of ultrasound.    Cherre Robins, MSN, CNM 06/04/2019, 11:17 PM   Reassessment (12:40 AM)  -Nurse reports patient back from ultrasound but technological issues are restricting [place] tech ability to upload preliminary report for final review. However technician reports no abnormalities or concerns noted.  -In room to discuss preliminary verbal findings with patient. -Patient reports some improvement in pain and states "it's better, but it's pretty much the same." -However, patient agreeable to script for flexeril for home usage. -Reiterated earlier teaching with inclusion of flexeril as needed. -Bleeding and PTL precautions given. -Encouraged to call or return to MAU if symptoms worsen or with the onset of new symptoms. -Discharged to  home in stable condition.  Cherre Robins MSN, CNM Advanced Practice Provider, [facility] [employer]

## 2019-06-04 NOTE — MAU Note (Signed)
[place] [address] getting out of the shower around 2030.  Reports no pain before the shower.  Now having constant pelvic pain.  No VB.  Not sure if she fell on her abdomen or not.

## 2019-06-05 ENCOUNTER — Inpatient Hospital Stay (HOSPITAL_BASED_OUTPATIENT_CLINIC_OR_DEPARTMENT_OTHER): Payer: BLUE CROSS/BLUE SHIELD

## 2019-06-05 ENCOUNTER — Inpatient Hospital Stay (HOSPITAL_COMMUNITY): Payer: BLUE CROSS/BLUE SHIELD

## 2019-06-05 DIAGNOSIS — O9A212 Injury, poisoning and certain other consequences of external causes complicating pregnancy, second trimester: Secondary | ICD-10-CM

## 2019-06-05 DIAGNOSIS — Y92009 Unspecified place in unspecified non-institutional (private) residence as the place of occurrence of the external cause: Secondary | ICD-10-CM | POA: Diagnosis not present

## 2019-06-05 DIAGNOSIS — Z3A22 22 weeks gestation of pregnancy: Secondary | ICD-10-CM | POA: Diagnosis not present

## 2019-06-05 DIAGNOSIS — W19XXXA Unspecified fall, initial encounter: Secondary | ICD-10-CM | POA: Diagnosis not present

## 2019-06-05 DIAGNOSIS — W1830XS Fall on same level, unspecified, sequela: Secondary | ICD-10-CM

## 2019-06-05 MED ORDER — CYCLOBENZAPRINE HCL 10 MG PO TABS: 10.0000 mg | ORAL_TABLET | Freq: Two times a day (BID) | ORAL | 0 refills | Status: DC | PRN

## 2019-06-05 NOTE — Discharge Instructions (Signed)
Musculoskeletal Pain Musculoskeletal pain refers to aches and pains in your bones, joints, muscles, and the tissues that surround them. This pain can occur in any part of the body. It can last for a short time (acute) or a long time (chronic). A physical exam, lab tests, and imaging studies may be done to find the cause of your musculoskeletal pain. Follow these instructions at home:  Lifestyle  Try to control or lower your stress levels. Stress increases muscle tension and can worsen musculoskeletal pain. It is important to recognize when you are anxious or stressed and learn ways to manage it. This may include: ? Meditation or yoga. ? Cognitive or behavioral therapy. ? Acupuncture or massage therapy.  You may continue all activities unless the activities cause more pain. When the pain gets better, slowly resume your normal activities. Gradually increase the intensity and duration of your activities or exercise. Managing pain, stiffness, and swelling  Take over-the-counter and prescription medicines only as told by your health care provider.  When your pain is severe, bed rest may be helpful. Lie or sit in any position that is comfortable, but get out of bed and walk around at least every couple of hours.  If directed, apply heat to the affected area as often as told by your health care provider. Use the heat source that your health care provider recommends, such as a moist heat pack or a heating pad. ? Place a towel between your skin and the heat source. ? Leave the heat on for 20-30 minutes. ? Remove the heat if your skin turns bright red. This is especially important if you are unable to feel pain, heat, or cold. You may have a greater risk of getting burned.  If directed, put ice on the painful area. ? Put ice in a plastic bag. ? Place a towel between your skin and the bag. ? Leave the ice on for 20 minutes, 2-3 times a day. General instructions  Your health care provider may  recommend that you see a physical therapist. This person can help you come up with a safe exercise program. Do any exercises as told by your physical therapist.  Keep all follow-up visits, including any physical therapy visits, as told by your health care providers. This is important. Contact a health care provider if:  Your pain gets worse.  Medicines do not help ease your pain.  You cannot use the part of your body that hurts, such as your arm, leg, or neck.  You have trouble sleeping.  You have trouble doing your normal activities. Get help right away if:  You have a new injury and your pain is worse or different.  You feel numb or you have tingling in the painful area. Summary  Musculoskeletal pain refers to aches and pains in your bones, joints, muscles, and the tissues that surround them.  This pain can occur in any part of the body.  Your health care provider may recommend that you see a physical therapist. This person can help you come up with a safe exercise program. Do any exercises as told by your physical therapist.  Lower your stress level. Stress can worsen musculoskeletal pain. Ways to lower stress may include meditation, yoga, cognitive or behavioral therapy, acupuncture, and massage therapy. This information is not intended to replace advice given to you by your health care provider. Make sure you discuss any questions you have with your health care provider. Document Released: 06/29/2005 Document Revised: 06/11/2017 Document Reviewed:   07/29/2016 Elsevier Patient Education  2020 Elsevier Inc.  

## 2019-07-14 NOTE — L&D Delivery Note (Signed)
Delivery Note At 4:16 AM a viable female was delivered via SVD APGAR: 8, 9;  weight  pending Placenta status: Spontaneous;Pathology, Intact.  Cord: 3 vessels double true knot, nuchal x 2 easily reduced.   Anesthesia: Epidural Episiotomy: None Lacerations: 2nd degree;Perineal Suture Repair: 3.0 vicryl Est. Blood Loss (mL): 550 cytotec given 1000 mcg rectally, bleeding now minimal, fundus firm  Mom to postpartum.  Baby to Couplet care / Skin to Skin.  Jillian Walter 10/10/2019, 4:42 AM

## 2019-09-22 ENCOUNTER — Inpatient Hospital Stay (HOSPITAL_BASED_OUTPATIENT_CLINIC_OR_DEPARTMENT_OTHER): Payer: No Typology Code available for payment source

## 2019-09-22 ENCOUNTER — Encounter (HOSPITAL_COMMUNITY): Payer: Self-pay | Admitting: Emergency Medicine

## 2019-09-22 ENCOUNTER — Inpatient Hospital Stay (HOSPITAL_COMMUNITY): Payer: No Typology Code available for payment source

## 2019-09-22 ENCOUNTER — Other Ambulatory Visit: Payer: Self-pay

## 2019-09-22 ENCOUNTER — Inpatient Hospital Stay (HOSPITAL_COMMUNITY)
Admission: AD | Admit: 2019-09-22 | Discharge: 2019-09-22 | Disposition: A | Discharge status: Home / Self Care | Payer: No Typology Code available for payment source | Attending: Obstetrics and Gynecology | Admitting: Obstetrics and Gynecology

## 2019-09-22 DIAGNOSIS — O9A213 Injury, poisoning and certain other consequences of external causes complicating pregnancy, third trimester: Secondary | ICD-10-CM

## 2019-09-22 DIAGNOSIS — Y9389 Activity, other specified: Secondary | ICD-10-CM | POA: Insufficient documentation

## 2019-09-22 DIAGNOSIS — O09523 Supervision of elderly multigravida, third trimester: Secondary | ICD-10-CM

## 2019-09-22 DIAGNOSIS — M25551 Pain in right hip: Secondary | ICD-10-CM | POA: Diagnosis not present

## 2019-09-22 DIAGNOSIS — Z3A37 37 weeks gestation of pregnancy: Secondary | ICD-10-CM | POA: Insufficient documentation

## 2019-09-22 DIAGNOSIS — Z87891 Personal history of nicotine dependence: Secondary | ICD-10-CM | POA: Insufficient documentation

## 2019-09-22 DIAGNOSIS — Y92410 Unspecified street and highway as the place of occurrence of the external cause: Secondary | ICD-10-CM | POA: Diagnosis not present

## 2019-09-22 DIAGNOSIS — Y999 Unspecified external cause status: Secondary | ICD-10-CM | POA: Diagnosis not present

## 2019-09-22 HISTORY — DX: Injury, poisoning and certain other consequences of external causes complicating pregnancy, third trimester: O9A.213

## 2019-09-22 MED ORDER — CYCLOBENZAPRINE HCL 10 MG PO TABS: 10.0000 mg | ORAL_TABLET | Freq: Two times a day (BID) | ORAL | 0 refills | Status: DC | PRN
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
## 2019-09-22 NOTE — Discharge Instructions (Signed)
You can take Tylenol 1000 mg by mouth every 6 hours if needed for pain in between the doses of Flexeril.
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
## 2019-10-02 ENCOUNTER — Other Ambulatory Visit: Payer: Self-pay

## 2019-10-02 ENCOUNTER — Inpatient Hospital Stay (HOSPITAL_COMMUNITY)
Admission: AD | Admit: 2019-10-02 | Discharge: 2019-10-03 | Disposition: A | Discharge status: Home / Self Care | Payer: Medicaid Other | Attending: Obstetrics and Gynecology | Admitting: Obstetrics and Gynecology

## 2019-10-02 ENCOUNTER — Encounter (HOSPITAL_COMMUNITY): Payer: Self-pay | Admitting: Obstetrics and Gynecology

## 2019-10-02 DIAGNOSIS — Z87891 Personal history of nicotine dependence: Secondary | ICD-10-CM | POA: Diagnosis not present

## 2019-10-02 DIAGNOSIS — O26893 Other specified pregnancy related conditions, third trimester: Secondary | ICD-10-CM | POA: Diagnosis not present

## 2019-10-02 DIAGNOSIS — N898 Other specified noninflammatory disorders of vagina: Secondary | ICD-10-CM | POA: Diagnosis not present

## 2019-10-02 DIAGNOSIS — O30043 Twin pregnancy, dichorionic/diamniotic, third trimester: Secondary | ICD-10-CM | POA: Insufficient documentation

## 2019-10-02 DIAGNOSIS — O30041 Twin pregnancy, dichorionic/diamniotic, first trimester: Secondary | ICD-10-CM

## 2019-10-02 DIAGNOSIS — Z3689 Encounter for other specified antenatal screening: Secondary | ICD-10-CM

## 2019-10-02 DIAGNOSIS — Z3A39 39 weeks gestation of pregnancy: Secondary | ICD-10-CM | POA: Diagnosis not present

## 2019-10-02 NOTE — MAU Note (Signed)
Patient reports losing her mucous plug this morning and has experienced some slow leaking of fluid since then.  Denies any VB.  Some brown discharge.  Contractions occurring occasionally though she hasn't timed them.  Endorses + FM.  1.5 cm last week in the office.

## 2019-10-03 DIAGNOSIS — N898 Other specified noninflammatory disorders of vagina: Secondary | ICD-10-CM

## 2019-10-03 DIAGNOSIS — Z3A39 39 weeks gestation of pregnancy: Secondary | ICD-10-CM | POA: Diagnosis not present

## 2019-10-03 DIAGNOSIS — O26893 Other specified pregnancy related conditions, third trimester: Secondary | ICD-10-CM

## 2019-10-03 LAB — AMNISURE RUPTURE OF MEMBRANE (ROM) NOT AT ARMC: Amnisure ROM: NEGATIVE

## 2019-10-03 LAB — POCT FERN TEST: POCT Fern Test: NEGATIVE

## 2019-10-03 NOTE — Discharge Instructions (Signed)
Braxton Hicks Contractions °Contractions of the uterus can occur throughout pregnancy, but they are not always a sign that you are in labor. You may have practice contractions called Braxton Hicks contractions. These false labor contractions are sometimes confused with true labor. °What are Braxton Hicks contractions? °Braxton Hicks contractions are tightening movements that occur in the muscles of the uterus before labor. Unlike true labor contractions, these contractions do not result in opening (dilation) and thinning of the cervix. Toward the end of pregnancy (32-34 weeks), Braxton Hicks contractions can happen more often and may become stronger. These contractions are sometimes difficult to tell apart from true labor because they can be very uncomfortable. You should not feel embarrassed if you go to the hospital with false labor. °Sometimes, the only way to tell if you are in true labor is for your health care provider to look for changes in the cervix. The health care provider will do a physical exam and may monitor your contractions. If you are not in true labor, the exam should show that your cervix is not dilating and your water has not broken. °If there are no other health problems associated with your pregnancy, it is completely safe for you to be sent home with false labor. You may continue to have Braxton Hicks contractions until you go into true labor. °How to tell the difference between true labor and false labor °True labor °· Contractions last 30-70 seconds. °· Contractions become very regular. °· Discomfort is usually felt in the top of the uterus, and it spreads to the lower abdomen and low back. °· Contractions do not go away with walking. °· Contractions usually become more intense and increase in frequency. °· The cervix dilates and gets thinner. °False labor °· Contractions are usually shorter and not as strong as true labor contractions. °· Contractions are usually irregular. °· Contractions  are often felt in the front of the lower abdomen and in the groin. °· Contractions may go away when you walk around or change positions while lying down. °· Contractions get weaker and are shorter-lasting as time goes on. °· The cervix usually does not dilate or become thin. °Follow these instructions at home: ° °· Take over-the-counter and prescription medicines only as told by your health care provider. °· Keep up with your usual exercises and follow other instructions from your health care provider. °· Eat and drink lightly if you think you are going into labor. °· If Braxton Hicks contractions are making you uncomfortable: °? Change your position from lying down or resting to walking, or change from walking to resting. °? Sit and rest in a tub of warm water. °? Drink enough fluid to keep your urine pale yellow. Dehydration may cause these contractions. °? Do slow and deep breathing several times an hour. °· Keep all follow-up prenatal visits as told by your health care provider. This is important. °Contact a health care provider if: °· You have a fever. °· You have continuous pain in your abdomen. °Get help right away if: °· Your contractions become stronger, more regular, and closer together. °· You have fluid leaking or gushing from your vagina. °· You pass blood-tinged mucus (bloody show). °· You have bleeding from your vagina. °· You have low back pain that you never had before. °· You feel your baby’s head pushing down and causing pelvic pressure. °· Your baby is not moving inside you as much as it used to. °Summary °· Contractions that occur before labor are   called Braxton Hicks contractions, false labor, or practice contractions. °· Braxton Hicks contractions are usually shorter, weaker, farther apart, and less regular than true labor contractions. True labor contractions usually become progressively stronger and regular, and they become more frequent. °· Manage discomfort from Braxton Hicks contractions  by changing position, resting in a warm bath, drinking plenty of water, or practicing deep breathing. °This information is not intended to replace advice given to you by your health care provider. Make sure you discuss any questions you have with your health care provider. °Document Revised: 06/11/2017 Document Reviewed: 11/12/2016 °Elsevier Patient Education © 2020 Elsevier Inc. ° °

## 2019-10-03 NOTE — MAU Provider Note (Signed)
History   [id number]   Chief Complaint  Patient presents with  . Rupture of Membranes  . Contractions    HPI Jillian Walter is a 34 y.o. female  G1P0 @39 .2 wks here with report of slow leaking of fluid since this am. Also having some brown discharge. Pt reports rare contractions. She denies vaginal bleeding. She reports good fetal movement. All other systems negative.    Patient's last menstrual period was 01/01/2019.  OB History  Gravida Para Term Preterm AB Living  1            SAB TAB Ectopic Multiple Live Births               # Outcome Date GA Lbr Len/2nd Weight Sex Delivery Anes PTL Lv  1 Current             Past Medical History:  Diagnosis Date  . Polycystic disease, ovaries     Family History  Problem Relation Age of Onset  . Hypertension Mother   . Diabetes Other   . Hypertension Other     Social History   Socioeconomic History  . Marital status: Married    Spouse name: Not on file  . Number of children: Not on file  . Years of education: Not on file  . Highest education level: Not on file  Occupational History  . Not on file  Tobacco Use  . Smoking status: Former Smoker    Packs/day: 0.50  . Smokeless tobacco: Former 01/03/2019 and Sexual Activity  . Alcohol use: Not Currently  . Drug use: No  . Sexual activity: Yes  Other Topics Concern  . Not on file  Social History Narrative  . Not on file   Social Determinants of Health   Financial Resource Strain:   . Difficulty of Paying Living Expenses:   Food Insecurity:   . Worried About Engineer, water in the Last Year:   . Programme researcher, broadcasting/film/video in the Last Year:   Transportation Needs:   . Barista (Medical):   Freight forwarder Lack of Transportation (Non-Medical):   Physical Activity:   . Days of Exercise per Week:   . Minutes of Exercise per Session:   Stress:   . Feeling of Stress :   Social Connections:   . Frequency of Communication with Friends and Family:   . Frequency of  Social Gatherings with Friends and Family:   . Attends Religious Services:   . Active Member of Clubs or Organizations:   . Attends [employer] Meetings:   Banker Marital Status:     Allergies  Allergen Reactions  . Latex Itching    No current facility-administered medications on file prior to encounter.   Current Outpatient Medications on File Prior to Encounter  Medication Sig Dispense Refill  . acetaminophen (TYLENOL) 500 MG tablet Take 500 mg by mouth every 6 (six) hours as needed.    [employer] albuterol (PROVENTIL HFA;VENTOLIN HFA) 108 (90 BASE) MCG/ACT inhaler Inhale 2 puffs into the lungs every 6 (six) hours as needed for wheezing or shortness of breath.    . cyclobenzaprine (FLEXERIL) 10 MG tablet Take 1 tablet (10 mg total) by mouth 2 (two) times daily as needed for muscle spasms. 20 tablet 0  . cyclobenzaprine (FLEXERIL) 10 MG tablet Take 1 tablet (10 mg total) by mouth 2 (two) times daily as needed for muscle spasms. 20 tablet 0  . Prenatal Vit-Fe Fumarate-FA (PRENATAL MULTIVITAMIN)  TABS Take 1 tablet by mouth daily.       Review of Systems  Gastrointestinal: Negative for abdominal pain.  Genitourinary: Positive for vaginal discharge.   Physical Exam   Vitals:   10/02/19 2332 10/02/19 2338  BP:  129/83  Pulse:  (!) 112  Resp:  19  Temp:  98.7 F (37.1 C)  Weight: 95.4 kg    Physical Exam  Constitutional: She is oriented to person, place, and time. She appears well-developed and well-nourished. No distress.  HENT:  Head: Normocephalic and atraumatic.  Cardiovascular: Normal rate.  Respiratory: Effort normal. No respiratory distress.  Musculoskeletal:        General: Normal range of motion.     Cervical back: Normal range of motion.  Neurological: She is alert and oriented to person, place, and time.  Psychiatric: She has a normal mood and affect.  EFM: 155 bpm, mod variability, + accels, no decels Toco: irregular  Results for orders placed or performed  during the hospital encounter of 10/02/19 (from the past 24 hour(s))  Amnisure rupture of membrane (rom)not at [facility]     Status: None   Collection Time: 10/02/19 11:55 PM  Result Value Ref Range   Amnisure ROM NEGATIVE    MAU Course  Procedures  MDM Labs ordered and reviewed. No evidence of SROM or labor. Stable for discharge home.   Assessment and Plan   1. [redacted] weeks gestation of pregnancy   2. Dichorionic diamniotic twin pregnancy in first trimester   3. Vaginal discharge during pregnancy in third trimester   4. NST (non-stress test) reactive    Discharge home Follow up at [facility] as scheduled Labor precautions  Allergies as of 10/03/2019      Reactions   Latex Itching      Medication List    TAKE these medications   acetaminophen 500 MG tablet Commonly known as: TYLENOL Take 500 mg by mouth every 6 (six) hours as needed.   albuterol 108 (90 Base) MCG/ACT inhaler Commonly known as: VENTOLIN HFA Inhale 2 puffs into the lungs every 6 (six) hours as needed for wheezing or shortness of breath.   cyclobenzaprine 10 MG tablet Commonly known as: FLEXERIL Take 1 tablet (10 mg total) by mouth 2 (two) times daily as needed for muscle spasms. What changed: Another medication with the same name was removed. Continue taking this medication, and follow the directions you see here.   prenatal multivitamin Tabs tablet Take 1 tablet by mouth daily.      Julianne Handler, CNM 10/03/2019 12:56 AM

## 2019-10-09 ENCOUNTER — Encounter (HOSPITAL_COMMUNITY): Payer: Self-pay | Admitting: Obstetrics & Gynecology

## 2019-10-09 ENCOUNTER — Inpatient Hospital Stay (EMERGENCY_DEPARTMENT_HOSPITAL)
Admission: AD | Admit: 2019-10-09 | Discharge: 2019-10-09 | Disposition: A | Discharge status: Home / Self Care | Payer: Medicaid Other | Source: Home / Self Care | Attending: Obstetrics & Gynecology | Admitting: Obstetrics & Gynecology

## 2019-10-09 ENCOUNTER — Inpatient Hospital Stay (HOSPITAL_COMMUNITY): Payer: Medicaid Other | Admitting: Anesthesiology

## 2019-10-09 ENCOUNTER — Inpatient Hospital Stay (HOSPITAL_COMMUNITY)
Admission: AD | Admit: 2019-10-09 | Discharge: 2019-10-09 | Disposition: A | Discharge status: Home / Self Care | Payer: Medicaid Other | Source: Home / Self Care | Attending: Obstetrics & Gynecology | Admitting: Obstetrics & Gynecology

## 2019-10-09 ENCOUNTER — Inpatient Hospital Stay (HOSPITAL_COMMUNITY)
Admission: AD | Admit: 2019-10-09 | Discharge: 2019-10-12 | DRG: 807 | Disposition: A | Discharge status: Home / Self Care | Payer: Medicaid Other | Attending: Obstetrics & Gynecology | Admitting: Obstetrics & Gynecology

## 2019-10-09 ENCOUNTER — Other Ambulatory Visit: Payer: Self-pay

## 2019-10-09 DIAGNOSIS — Z3A40 40 weeks gestation of pregnancy: Secondary | ICD-10-CM

## 2019-10-09 DIAGNOSIS — O26893 Other specified pregnancy related conditions, third trimester: Secondary | ICD-10-CM | POA: Diagnosis present

## 2019-10-09 DIAGNOSIS — Z3689 Encounter for other specified antenatal screening: Secondary | ICD-10-CM | POA: Diagnosis not present

## 2019-10-09 DIAGNOSIS — O09513 Supervision of elderly primigravida, third trimester: Secondary | ICD-10-CM | POA: Insufficient documentation

## 2019-10-09 DIAGNOSIS — Z87891 Personal history of nicotine dependence: Secondary | ICD-10-CM

## 2019-10-09 DIAGNOSIS — O479 False labor, unspecified: Secondary | ICD-10-CM

## 2019-10-09 DIAGNOSIS — Z20822 Contact with and (suspected) exposure to covid-19: Secondary | ICD-10-CM | POA: Diagnosis present

## 2019-10-09 LAB — POCT FERN TEST
POCT Fern Test: NEGATIVE
POCT Fern Test: POSITIVE

## 2019-10-09 LAB — CBC
HCT: 34.3 % — ABNORMAL LOW (ref 36.0–46.0)
Hemoglobin: 11.7 g/dL — ABNORMAL LOW (ref 12.0–15.0)
MCH: 30.6 pg (ref 26.0–34.0)
MCHC: 34.1 g/dL (ref 30.0–36.0)
MCV: 89.8 fL (ref 80.0–100.0)
Platelets: 244 10*3/uL (ref 150–400)
RBC: 3.82 MIL/uL — ABNORMAL LOW (ref 3.87–5.11)
RDW: 13.2 % (ref 11.5–15.5)
WBC: 13.7 10*3/uL — ABNORMAL HIGH (ref 4.0–10.5)
nRBC: 0 % (ref 0.0–0.2)

## 2019-10-09 LAB — URINALYSIS, ROUTINE W REFLEX MICROSCOPIC
Bilirubin Urine: NEGATIVE
Glucose, UA: NEGATIVE mg/dL
Hgb urine dipstick: NEGATIVE
Ketones, ur: NEGATIVE mg/dL
Leukocytes,Ua: NEGATIVE
Nitrite: NEGATIVE
Protein, ur: 30 mg/dL — AB
Specific Gravity, Urine: 1.019 (ref 1.005–1.030)
pH: 7 (ref 5.0–8.0)

## 2019-10-09 LAB — TYPE AND SCREEN
ABO/RH(D): B POS
Antibody Screen: NEGATIVE

## 2019-10-09 LAB — RESPIRATORY PANEL BY RT PCR (FLU A&B, COVID)
Influenza A by PCR: NEGATIVE
Influenza B by PCR: NEGATIVE
SARS Coronavirus 2 by RT PCR: NEGATIVE

## 2019-10-09 MED ORDER — OXYCODONE-ACETAMINOPHEN 5-325 MG PO TABS: 1.0000 | ORAL_TABLET | ORAL | Status: DC | PRN

## 2019-10-09 MED ORDER — LIDOCAINE HCL (PF) 1 % IJ SOLN: 30.0000 mL | INTRAMUSCULAR | Status: DC | PRN

## 2019-10-09 MED ORDER — OXYCODONE-ACETAMINOPHEN 5-325 MG PO TABS: 2.0000 | ORAL_TABLET | ORAL | Status: DC | PRN

## 2019-10-09 MED ORDER — LACTATED RINGERS IV SOLN: INTRAVENOUS | Status: DC

## 2019-10-09 MED ORDER — LACTATED RINGERS IV SOLN: 500.0000 mL | INTRAVENOUS | Status: DC | PRN

## 2019-10-09 MED ORDER — BUTORPHANOL TARTRATE 1 MG/ML IJ SOLN
2.0000 mg | Freq: Once | INTRAMUSCULAR | Status: AC
  Administered 2019-10-09: 2 mg via INTRAVENOUS
  Filled 2019-10-09: qty 2

## 2019-10-09 MED ORDER — PHENYLEPHRINE 40 MCG/ML (10ML) SYRINGE FOR IV PUSH (FOR BLOOD PRESSURE SUPPORT): 80.0000 ug | PREFILLED_SYRINGE | INTRAVENOUS | Status: DC | PRN

## 2019-10-09 MED ORDER — FENTANYL-BUPIVACAINE-NACL 0.5-0.125-0.9 MG/250ML-% EP SOLN
12.0000 mL/h | EPIDURAL | Status: DC | PRN
  Filled 2019-10-09: qty 250

## 2019-10-09 MED ORDER — OXYTOCIN BOLUS FROM INFUSION
500.0000 mL | Freq: Once | INTRAVENOUS | Status: AC
  Administered 2019-10-10: 500 mL via INTRAVENOUS

## 2019-10-09 MED ORDER — HYDROXYZINE PAMOATE 50 MG PO CAPS: 50.0000 mg | ORAL_CAPSULE | ORAL | 0 refills | Status: DC | PRN

## 2019-10-09 MED ORDER — EPHEDRINE 5 MG/ML INJ: 10.0000 mg | INTRAVENOUS | Status: DC | PRN

## 2019-10-09 MED ORDER — DIPHENHYDRAMINE HCL 50 MG/ML IJ SOLN: 12.5000 mg | INTRAMUSCULAR | Status: DC | PRN

## 2019-10-09 MED ORDER — SOD CITRATE-CITRIC ACID 500-334 MG/5ML PO SOLN: 30.0000 mL | ORAL | Status: DC | PRN

## 2019-10-09 MED ORDER — FENTANYL-BUPIVACAINE-NACL 0.5-0.125-0.9 MG/250ML-% EP SOLN
EPIDURAL | Status: AC
  Filled 2019-10-09: qty 250

## 2019-10-09 MED ORDER — PHENYLEPHRINE 40 MCG/ML (10ML) SYRINGE FOR IV PUSH (FOR BLOOD PRESSURE SUPPORT)
80.0000 ug | PREFILLED_SYRINGE | INTRAVENOUS | Status: DC | PRN
  Filled 2019-10-09: qty 10

## 2019-10-09 MED ORDER — LIDOCAINE-EPINEPHRINE (PF) 2 %-1:200000 IJ SOLN
INTRAMUSCULAR | Status: DC | PRN
  Administered 2019-10-09 (×2): 2 mL via EPIDURAL

## 2019-10-09 MED ORDER — PROMETHAZINE HCL 12.5 MG PO TABS: 25.0000 mg | ORAL_TABLET | Freq: Four times a day (QID) | ORAL | 0 refills | Status: DC | PRN

## 2019-10-09 MED ORDER — PROMETHAZINE HCL 25 MG/ML IJ SOLN
12.5000 mg | Freq: Once | INTRAMUSCULAR | Status: AC
  Administered 2019-10-09: 12.5 mg via INTRAVENOUS
  Filled 2019-10-09: qty 1

## 2019-10-09 MED ORDER — LACTATED RINGERS IV SOLN
500.0000 mL | Freq: Once | INTRAVENOUS | Status: AC
  Administered 2019-10-09: 500 mL via INTRAVENOUS

## 2019-10-09 MED ORDER — OXYTOCIN 40 UNITS IN NORMAL SALINE INFUSION - SIMPLE MED
2.5000 [IU]/h | INTRAVENOUS | Status: DC
  Filled 2019-10-09: qty 1000

## 2019-10-09 MED ORDER — ACETAMINOPHEN 325 MG PO TABS
650.0000 mg | ORAL_TABLET | ORAL | Status: DC | PRN
  Administered 2019-10-10 (×2): 650 mg via ORAL
  Filled 2019-10-09 (×2): qty 2

## 2019-10-09 MED ORDER — SODIUM CHLORIDE (PF) 0.9 % IJ SOLN
INTRAMUSCULAR | Status: DC | PRN
  Administered 2019-10-09: 12 mL/h via EPIDURAL

## 2019-10-09 MED ORDER — ONDANSETRON HCL 4 MG/2ML IJ SOLN
4.0000 mg | Freq: Four times a day (QID) | INTRAMUSCULAR | Status: DC | PRN
  Administered 2019-10-10: 4 mg via INTRAVENOUS
  Filled 2019-10-09: qty 2

## 2019-10-09 NOTE — Anesthesia Procedure Notes (Signed)
Epidural Patient location during procedure: OB Start time: 10/09/2019 9:50 PM End time: 10/09/2019 10:05 PM  Staffing Anesthesiologist: Elmer Picker, MD Performed: anesthesiologist   Preanesthetic Checklist Completed: patient identified, IV checked, risks and benefits discussed, monitors and equipment checked, pre-op evaluation and timeout performed  Epidural Patient position: sitting Prep: DuraPrep and site prepped and draped Patient monitoring: continuous pulse ox, blood pressure, heart rate and cardiac monitor Approach: midline Location: L3-L4 Injection technique: LOR air  Needle:  Needle type: Tuohy  Needle gauge: 17 G Needle length: 9 cm Needle insertion depth: 6 cm Catheter type: closed end flexible Catheter size: 19 Gauge Catheter at skin depth: 12 cm Test dose: negative  Assessment Sensory level: T8 Events: blood not aspirated, injection not painful, no injection resistance, no paresthesia and negative IV test  Additional Notes Patient identified. Risks/Benefits/Options discussed with patient including but not limited to bleeding, infection, nerve damage, paralysis, failed block, incomplete pain control, headache, blood pressure changes, nausea, vomiting, reactions to medication both or allergic, itching and postpartum back pain. Confirmed with bedside nurse the patient's most recent platelet count. Confirmed with patient that they are not currently taking any anticoagulation, have any bleeding history or any family history of bleeding disorders. Patient expressed understanding and wished to proceed. All questions were answered. Sterile technique was used throughout the entire procedure. Please see nursing notes for vital signs. Test dose was given through epidural catheter and negative prior to continuing to dose epidural or start infusion. Warning signs of high block given to the patient including shortness of breath, tingling/numbness in hands, complete motor block,  or any concerning symptoms with instructions to call for help. Patient was given instructions on fall risk and not to get out of bed. All questions and concerns addressed with instructions to call with any issues or inadequate analgesia.  Reason for block:procedure for pain

## 2019-10-09 NOTE — H&P (Signed)
Jillian Walter is a 34 y.o. female presenting for G1P0 at 40.1 weeks who returns to MAU with confirmed SROM at 2000 and progressive contractions. Denies vb.   OB History    Gravida  1   Para      Term      Preterm      AB      Living        SAB      TAB      Ectopic      Multiple      Live Births             Past Medical History:  Diagnosis Date  . Polycystic disease, ovaries    Past Surgical History:  Procedure Laterality Date  . extraction of wisdom teeth     Family History: family history includes Diabetes in an other family member; Hypertension in her mother and another family member. Social History:  reports that she has quit smoking. She smoked 0.50 packs per day. She has quit using smokeless tobacco. She reports previous alcohol use. She reports that she does not use drugs.     Maternal Diabetes: No Genetic Screening: Normal Maternal Ultrasounds/Referrals: Normal Fetal Ultrasounds or other Referrals:  None Maternal Substance Abuse:  No Significant Maternal Medications:  None Significant Maternal Lab Results:  Group B Strep negative Other Comments:  None  Review of Systems  Constitutional: Negative.   HENT: Negative.   Eyes: Negative.   Respiratory: Negative.   Cardiovascular: Negative.   Gastrointestinal: Negative.   Endocrine: Negative.   Genitourinary: Negative.   Musculoskeletal: Negative.   Skin: Negative.   Allergic/Immunologic: Negative.   Neurological: Negative.   Hematological: Negative.   Psychiatric/Behavioral: Negative.    History Dilation: 5 Effacement (%): 90 Station: -2 Exam by:: Camelia Eng, RN Blood pressure 137/72, pulse 96, temperature 98.4 F (36.9 C), temperature source Oral, resp. rate 18, height 5\' 4"  (1.626 m), weight 95.7 kg, last menstrual period 01/01/2019, SpO2 100 %, unknown if currently breastfeeding. Exam Physical Exam  Constitutional: She is oriented to person, place, and time. She appears  well-developed and well-nourished.  HENT:  Head: Normocephalic.  Eyes: Pupils are equal, round, and reactive to light.  Respiratory: Effort normal.  Musculoskeletal:        General: Normal range of motion.     Cervical back: Normal range of motion.  Neurological: She is alert and oriented to person, place, and time.  Psychiatric: She has a normal mood and affect. Her behavior is normal. Judgment and thought content normal.   Cat I FHR tracing  Prenatal labs: ABO, Rh: --/--/B POS (03/29 2048) Antibody: NEG (03/29 2048) Rubella:  immune RPR:   neg HBsAg:   neg HIV: Non Reactive (08/03 1355)  GBS:   neg  Assessment/Plan: Admit for SROM and active labor Epidural as desires Anticipate SVD  Jennifer B Crumpler 10/09/2019, 10:05 PM

## 2019-10-09 NOTE — Anesthesia Preprocedure Evaluation (Signed)
Anesthesia Evaluation  Patient identified by MRN, date of birth, ID band Patient awake    Reviewed: Allergy & Precautions, NPO status , Patient's Chart, lab work & pertinent test results  Airway Mallampati: II  TM Distance: >3 FB Neck ROM: Full    Dental no notable dental hx.    Pulmonary Current SmokerPatient did not abstain from smoking.,    Pulmonary exam normal breath sounds clear to auscultation       Cardiovascular negative cardio ROS Normal cardiovascular exam Rhythm:Regular Rate:Normal     Neuro/Psych negative neurological ROS  negative psych ROS   GI/Hepatic negative GI ROS, Neg liver ROS,   Endo/Other  negative endocrine ROS  Renal/GU negative Renal ROS  negative genitourinary   Musculoskeletal negative musculoskeletal ROS (+)   Abdominal   Peds  Hematology negative hematology ROS (+)   Anesthesia Other Findings   Reproductive/Obstetrics (+) Pregnancy                             Anesthesia Physical Anesthesia Plan  ASA: II  Anesthesia Plan: Epidural   Post-op Pain Management:    Induction:   PONV Risk Score and Plan: Treatment may vary due to age or medical condition  Airway Management Planned: Natural Airway  Additional Equipment:   Intra-op Plan:   Post-operative Plan:   Informed Consent: I have reviewed the patients History and Physical, chart, labs and discussed the procedure including the risks, benefits and alternatives for the proposed anesthesia with the patient or authorized representative who has indicated his/her understanding and acceptance.       Plan Discussed with: Anesthesiologist  Anesthesia Plan Comments: (Patient identified. Risks, benefits, options discussed with patient including but not limited to bleeding, infection, nerve damage, paralysis, failed block, incomplete pain control, headache, blood pressure changes, nausea, vomiting,  reactions to medication, itching, and post partum back pain. Confirmed with bedside nurse the patient's most recent platelet count. Confirmed with the patient that they are not taking any anticoagulation, have any bleeding history or any family history of bleeding disorders. Patient expressed understanding and wishes to proceed. All questions were answered. )        Anesthesia Quick Evaluation

## 2019-10-09 NOTE — MAU Note (Signed)
.  Jillian Walter is a 34 y.o. at [redacted]w[redacted]d here in MAU reporting: that she is having contractions every 5 mins. She was evaluated earlier this morning in MAU and sent home.   Onset of complaint:  Pain score: 8 There were no vitals filed for this visit.   FHT:145 Lab orders placed from triage:

## 2019-10-09 NOTE — MAU Note (Signed)
. .  Jillian Walter is a 34 y.o. at [redacted]w[redacted]d here in MAU reporting: Patient states that her ctx started around 0630 this morning and they are x5 minutes apart. She states she is having watery vaginal discharge. No VB. Endorses good fetal movement. Patient states she was 2 cm last week.   Pain score: 8 Vitals:   10/09/19 0951  BP: 134/82  Pulse: (!) 121  Resp: 16  Temp: (!) 97.4 F (36.3 C)     FHT:160 Lab orders placed from triage:

## 2019-10-09 NOTE — MAU Note (Signed)
Due to maternal discomfort, difficult to trace baby. Tracing intermittently. CNM made aware.

## 2019-10-09 NOTE — Discharge Instructions (Signed)

## 2019-10-09 NOTE — Progress Notes (Signed)
Jillian Walter is a 34 y.o. G1P0 at [redacted]w[redacted]d by  Admitted for SROM at 2000 and active labor Comfortable with epidural  Objective: BP 126/73   Pulse (!) 101   Temp 99 F (37.2 C) (Oral)   Resp 17   Ht 5\' 4"  (1.626 m)   Wt 95.7 kg   LMP 01/01/2019   SpO2 100%   BMI 36.22 kg/m   FHT:  FHR: 170 bpm, variability: moderate,  accelerations:  Abscent,  decelerations:  Absent UC:   regular, every 4 minutes SVE:   Dilation: 8 Effacement (%): 90 Station: -1 Exam by:: e. poore rnc  Labs: Lab Results  Component Value Date   WBC 13.7 (H) 10/09/2019   HGB 11.7 (L) 10/09/2019   HCT 34.3 (L) 10/09/2019   MCV 89.8 10/09/2019   PLT 244 10/09/2019    Assessment / Plan: Spontaneous labor, progressing normally  Labor: Progressing normally Preeclampsia:  no signs or symptoms of toxicity Fetal Wellbeing:  Cat II tracing due to fetal tachycardia. Suggest maternal fluid bolus and tylenol as maternal low grade temp present Pain Control:  Epidural I/D:  n/a Anticipated MOD:  NSVD  Tycho Cheramie B Leoma Folds 10/09/2019, 11:46 PM

## 2019-10-09 NOTE — MAU Note (Signed)
Pt given discharge instructions verbally,  verbalized understanding and discharged home

## 2019-10-09 NOTE — Progress Notes (Signed)
History     CSN: [id number]  Arrival date and time: 10/09/19 1308   First Provider Initiated Contact with Patient 10/09/19 1346      Chief Complaint  Patient presents with  . Contractions   HPI Mrs. Warrior is a 34 year old G1P0 at 40.1 weeks who presents for complaints of irregular contractions. Denies vb or lof.  Pt was discharged from the MAU earlier today, combative and disruptive to staff. She refused intervention and refused telehealth intervention and RX. She remains combative refusing assistive measure for her discomfort. Ginger, RN requested for chaperone at bedside as the pt continues to curse and yell at the staff and CNM.  Mrs. Terris continues to yell and requests to "get ** her papers now so she can go the ** home" OB History     Gravida  1   Para      Term      Preterm      AB      Living         SAB      TAB      Ectopic      Multiple      Live Births              Past Medical History:  Diagnosis Date  . Polycystic disease, ovaries     Past Surgical History:  Procedure Laterality Date  . extraction of wisdom teeth      Family History  Problem Relation Age of Onset  . Hypertension Mother   . Diabetes Other   . Hypertension Other     Social History   Tobacco Use  . Smoking status: Former Smoker    Packs/day: 0.50  . Smokeless tobacco: Former Network engineer Use Topics  . Alcohol use: Not Currently  . Drug use: No    Allergies:  Allergies  Allergen Reactions  . Latex Itching    Medications Prior to Admission  Medication Sig Dispense Refill Last Dose  . acetaminophen (TYLENOL) 500 MG tablet Take 500 mg by mouth every 6 (six) hours as needed.     Marland Kitchen albuterol (PROVENTIL HFA;VENTOLIN HFA) 108 (90 BASE) MCG/ACT inhaler Inhale 2 puffs into the lungs every 6 (six) hours as needed for wheezing or shortness of breath.     . cyclobenzaprine (FLEXERIL) 10 MG tablet Take 1 tablet (10 mg total) by mouth 2 (two) times daily as needed for  muscle spasms. 20 tablet 0   . hydrOXYzine (VISTARIL) 50 MG capsule Take 1 capsule (50 mg total) by mouth every 4 (four) hours as needed (pain). 15 capsule 0   . Prenatal Vit-Fe Fumarate-FA (PRENATAL MULTIVITAMIN) TABS Take 1 tablet by mouth daily.     . promethazine (PHENERGAN) 12.5 MG tablet Take 2 tablets (25 mg total) by mouth every 6 (six) hours as needed (nausea/pain). 30 tablet 0     Review of Systems Physical Exam   Blood pressure 108/80, pulse (!) 107, temperature 98 F (36.7 C), resp. rate 16, last menstrual period 01/01/2019, unknown if currently breastfeeding.  Physical Exam  Constitutional: She is oriented to person, place, and time. She appears well-developed and well-nourished.  Respiratory: Effort normal.  Musculoskeletal:        General: Normal range of motion.  Neurological: She is alert and oriented to person, place, and time.  Psychiatric:  Yelling, combative. Exam limited as pt moving frantically around the room taking off monitors and hospital clothing  SVE Per RN Ginger unchanged  from several hours ago Cat I FHR tracing, reactive NST Toco ctx q 7-8 minutes  MAU Course  PT combative and disruptive despite plan of care discussion Refusing attempt at relief measures to include medication for pain, early labor, and therapeutic rest Refusing to stay for SVE reassessment to determine if in active labor Encouraged pt to allow [place] to assess appropriately and importance of doing so Encouraged pt to attempt at least home RX for her discomfort   Assessment and Plan  PT requesting to leave immediately Attempted to calm pt and provide rational for plan of care DC home Precautions given Reminded of RX for home discomfort Return if symptoms worsen Encouraged to keep office appt tomorrow   Victorino Dike B Phillippe Orlick 10/09/2019, 1:58 PM

## 2019-10-09 NOTE — MAU Provider Note (Signed)
  S: Ms. Jillian Walter is a 34 y.o. G1P0 at [redacted]w[redacted]d  who presents to MAU today complaining of leaking of fluid since 0630. She denies vaginal bleeding. She endorses contractions. She reports normal fetal movement.    O: BP 134/82 (BP Location: Right Arm)   Pulse (!) 121   Temp (!) 97.4 F (36.3 C) (Oral)   Resp 16   Ht 5\' 3"  (1.6 m)   LMP 01/01/2019   BMI 37.26 kg/m  GENERAL: Well-developed, well-nourished female in no acute distress.  HEAD: Normocephalic, atraumatic.  CHEST: Normal effort of breathing, regular heart rate ABDOMEN: Soft, nontender, gravid PELVIC: Normal external female genitalia. Vagina is pink and rugated. Cervix with normal contour, no lesions. Normal discharge.  Negative pooling.   Cervical exam:  Dilation: 3 Effacement (%): 80 Cervical Position: Middle Station: -2 Presentation: Vertex Exam by:: [email or web address] Flippin RN   Fetal Monitoring: Baseline: 155 Variability: Mod Accelerations: POS 15 x 15 Decelerations: N/A Contractions: Irregular, q 4-7 min  Results for orders placed or performed during the hospital encounter of 10/09/19 (from the past 24 hour(s))  Urinalysis, Routine w reflex microscopic     Status: Abnormal   Collection Time: 10/09/19  9:45 AM  Result Value Ref Range   Color, Urine YELLOW YELLOW   APPearance HAZY (A) CLEAR   Specific Gravity, Urine 1.019 1.005 - 1.030   pH 7.0 5.0 - 8.0   Glucose, UA NEGATIVE NEGATIVE mg/dL   Hgb urine dipstick NEGATIVE NEGATIVE   Bilirubin Urine NEGATIVE NEGATIVE   Ketones, ur NEGATIVE NEGATIVE mg/dL   Protein, ur 30 (A) NEGATIVE mg/dL   Nitrite NEGATIVE NEGATIVE   Leukocytes,Ua NEGATIVE NEGATIVE   RBC / HPF 0-5 0 - 5 RBC/hpf   WBC, UA 0-5 0 - 5 WBC/hpf   Bacteria, UA RARE (A) NONE SEEN   Squamous Epithelial / LPF 11-20 0 - 5   Mucus PRESENT    A: 34 y.o. G1P0 at [redacted]w[redacted]d  S/p sterile spec exam by CNM Intact amniotic sac (negative pooling, negative Fern x 2) Initial exam by me: 2cm, unchanged from  clinic Cervical change from 2-3 cm in MAU Patient encouraged by CNM to stay for additional hour of monitoring CNM discussed likelihood of early labor, anticipate additional cervical change given contraction pattern Patient requests discharge home, verbalizes intention to report to different hospital for admission Patient encouraged by RN to reconsider reevaluation in one hour Patient restates desire for discharge home  P: Discharge home in stable condition with labor precautions  F/U: Next appt with [facility] is tomorrow  [redacted]w[redacted]d, CNM 10/09/2019 11:33 AM

## 2019-10-10 ENCOUNTER — Encounter (HOSPITAL_COMMUNITY): Payer: Self-pay | Admitting: Obstetrics & Gynecology

## 2019-10-10 LAB — CBC
HCT: 27.5 % — ABNORMAL LOW (ref 36.0–46.0)
Hemoglobin: 8.9 g/dL — ABNORMAL LOW (ref 12.0–15.0)
MCH: 29.9 pg (ref 26.0–34.0)
MCHC: 32.4 g/dL (ref 30.0–36.0)
MCV: 92.3 fL (ref 80.0–100.0)
Platelets: 197 10*3/uL (ref 150–400)
RBC: 2.98 MIL/uL — ABNORMAL LOW (ref 3.87–5.11)
RDW: 13.4 % (ref 11.5–15.5)
WBC: 20.4 10*3/uL — ABNORMAL HIGH (ref 4.0–10.5)
nRBC: 0 % (ref 0.0–0.2)

## 2019-10-10 LAB — RPR: RPR Ser Ql: NONREACTIVE

## 2019-10-10 MED ORDER — DOCUSATE SODIUM 100 MG PO CAPS
100.0000 mg | ORAL_CAPSULE | Freq: Two times a day (BID) | ORAL | Status: DC
  Administered 2019-10-10 – 2019-10-12 (×4): 100 mg via ORAL
  Filled 2019-10-10 (×4): qty 1

## 2019-10-10 MED ORDER — BISACODYL 10 MG RE SUPP: 10.0000 mg | Freq: Every day | RECTAL | Status: DC | PRN

## 2019-10-10 MED ORDER — ZOLPIDEM TARTRATE 5 MG PO TABS: 5.0000 mg | ORAL_TABLET | Freq: Every evening | ORAL | Status: DC | PRN

## 2019-10-10 MED ORDER — FLEET ENEMA 7-19 GM/118ML RE ENEM: 1.0000 | ENEMA | Freq: Every day | RECTAL | Status: DC | PRN

## 2019-10-10 MED ORDER — ONDANSETRON HCL 4 MG PO TABS: 4.0000 mg | ORAL_TABLET | ORAL | Status: DC | PRN

## 2019-10-10 MED ORDER — COCONUT OIL OIL: 1.0000 "application " | TOPICAL_OIL | Status: DC | PRN

## 2019-10-10 MED ORDER — OXYCODONE-ACETAMINOPHEN 5-325 MG PO TABS
1.0000 | ORAL_TABLET | Freq: Four times a day (QID) | ORAL | Status: DC | PRN
  Administered 2019-10-12: 07:00:00 1 via ORAL
  Filled 2019-10-10: qty 1

## 2019-10-10 MED ORDER — MEDROXYPROGESTERONE ACETATE 150 MG/ML IM SUSP: 150.0000 mg | INTRAMUSCULAR | Status: DC | PRN

## 2019-10-10 MED ORDER — TETANUS-DIPHTH-ACELL PERTUSSIS 5-2.5-18.5 LF-MCG/0.5 IM SUSP: 0.5000 mL | Freq: Once | INTRAMUSCULAR | Status: DC

## 2019-10-10 MED ORDER — IBUPROFEN 600 MG PO TABS
600.0000 mg | ORAL_TABLET | Freq: Four times a day (QID) | ORAL | Status: DC
  Administered 2019-10-10 – 2019-10-12 (×10): 600 mg via ORAL
  Filled 2019-10-10 (×10): qty 1

## 2019-10-10 MED ORDER — MISOPROSTOL 200 MCG PO TABS
1000.0000 ug | ORAL_TABLET | Freq: Once | ORAL | Status: AC
  Administered 2019-10-10: 1000 ug via RECTAL

## 2019-10-10 MED ORDER — SIMETHICONE 80 MG PO CHEW
80.0000 mg | CHEWABLE_TABLET | ORAL | Status: DC | PRN
  Administered 2019-10-12: 08:00:00 80 mg via ORAL
  Filled 2019-10-10: qty 1

## 2019-10-10 MED ORDER — PRENATAL MULTIVITAMIN CH
1.0000 | ORAL_TABLET | Freq: Every day | ORAL | Status: DC
  Administered 2019-10-10 – 2019-10-12 (×3): 1 via ORAL
  Filled 2019-10-10 (×3): qty 1

## 2019-10-10 MED ORDER — BENZOCAINE-MENTHOL 20-0.5 % EX AERO
1.0000 "application " | INHALATION_SPRAY | CUTANEOUS | Status: DC | PRN
  Administered 2019-10-10: 1 via TOPICAL
  Filled 2019-10-10: qty 56

## 2019-10-10 MED ORDER — WITCH HAZEL-GLYCERIN EX PADS
1.0000 "application " | MEDICATED_PAD | CUTANEOUS | Status: DC | PRN
  Administered 2019-10-10: 1 via TOPICAL

## 2019-10-10 MED ORDER — DIPHENHYDRAMINE HCL 25 MG PO CAPS: 25.0000 mg | ORAL_CAPSULE | Freq: Four times a day (QID) | ORAL | Status: DC | PRN

## 2019-10-10 MED ORDER — ACETAMINOPHEN 325 MG PO TABS
650.0000 mg | ORAL_TABLET | ORAL | Status: DC | PRN
  Administered 2019-10-10 – 2019-10-12 (×4): 650 mg via ORAL
  Filled 2019-10-10 (×4): qty 2

## 2019-10-10 MED ORDER — SENNOSIDES-DOCUSATE SODIUM 8.6-50 MG PO TABS
2.0000 | ORAL_TABLET | ORAL | Status: DC
  Administered 2019-10-10: 2 via ORAL
  Filled 2019-10-10: qty 2

## 2019-10-10 MED ORDER — MISOPROSTOL 200 MCG PO TABS
ORAL_TABLET | ORAL | Status: AC
  Filled 2019-10-10: qty 5

## 2019-10-10 MED ORDER — ONDANSETRON HCL 4 MG/2ML IJ SOLN: 4.0000 mg | INTRAMUSCULAR | Status: DC | PRN

## 2019-10-10 MED ORDER — DIBUCAINE (PERIANAL) 1 % EX OINT
1.0000 "application " | TOPICAL_OINTMENT | CUTANEOUS | Status: DC | PRN
  Administered 2019-10-10: 1 via RECTAL
  Filled 2019-10-10: qty 28

## 2019-10-10 NOTE — Progress Notes (Signed)
Jillian Walter is a 34 y.o. G1P0 at [redacted]w[redacted]d by  Admitted for SROM at 2000 and active labor Comfortable with epidural  Objective: BP (!) 115/49   Pulse (!) 106   Temp 99.4 F (37.4 C) (Oral)   Resp 18   Ht 5\' 4"  (1.626 m)   Wt 95.7 kg   LMP 01/01/2019   SpO2 100%   BMI 36.22 kg/m   FHT:  FHR 170s, mod variability Accels 10x10 present, decels absent, Cat II tracing overall UC:   regular, every 4 minutes SVE:   Dilation: Lip/rim Effacement (%): 100 Station: -1 Exam by:: e. poore rnc  Labs: Lab Results  Component Value Date   WBC 13.7 (H) 10/09/2019   HGB 11.7 (L) 10/09/2019   HCT 34.3 (L) 10/09/2019   MCV 89.8 10/09/2019   PLT 244 10/09/2019    Assessment / Plan: Spontaneous labor, progressing normally  Labor: Progressing normally. Labor down x 1 hour.  Preeclampsia:  no signs or symptoms of toxicity Fetal Wellbeing:  Cat II tracing due to fetal tachycardia.  Pain Control:  Epidural I/D:  n/a Anticipated MOD:  NSVD  Jillian Walter 10/10/2019, 2:34 AM

## 2019-10-10 NOTE — Lactation Note (Addendum)
This note was copied from a baby's chart. Lactation Consultation Note:  Mother is a P6, infant is 68 hours old  Mother was given [facility] brochure and basic teaching done.  Mother has PCOS. She reports that she had a very hard time becoming pregnant. She reports that she had a twin pregnancy and lost twin at 4-5 weeks also found a third sack. She reports that could have had triples.   Mother reports that infant is very sleepy and doesn't open her mouth wide. She reports that she sucks on her lips and her tongue.   Reviewed hand expression with mother. Observed large drops of colostrum. Mother . Mothers nipples are erect with compressible breast tissue. No observed trama of mothers nipples.   She is active with [employer]. WIC.  Attempt to latch infant multiple times in cross cradle hold and football. Infant suckled several times but no sustained latch.  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 She does tongue thrusting on a glove finger.  Infant was fed 1 ml of ebm with a spoon.   Attempt to latch infant again and infant became spitty.   Plan of Care : Breastfeed infant with feeding cues elines. Encouraged mother to continue to offer ebm with a spoon. Mother effective with hand expression.   Staff nurse Ladona Ridgel to sat up a DEBP for mother  And have her pump.  LC gave Harmony hand pump and advised to pump after feeding attempts.   Mother to continue to cue base feed infant and feed at least 8-12 times or more in 24 hours and advised to allow for cluster feeding infant as needed.   Mother to continue to due STS. Mother is aware of available LC services at [facility], BFSG'S, OP Dept, and phone # for questions or concerns about breastfeeding.  Mother receptive to all teaching and plan of care.    Patient Name: Girl Liliana Dang [id number] Date: 10/10/2019 Reason for consult: Initial assessment   Maternal Data Has patient been taught Hand Expression?: Yes  Feeding Feeding Type: Breast Fed  LATCH  Score                   Interventions    Lactation Tools Discussed/Used WIC Program: Yes   Consult Status      Michel Bickers 10/10/2019, 1:23 PM

## 2019-10-10 NOTE — Progress Notes (Signed)
Patient ID: Jillian Walter, female   DOB: August 06, 1985, 34 y.o.   MRN: [id number] INTERVAL NOTE: S/P NSVD  Live born female  Birth Weight: 7 lb 11.5 oz (3501 g) APGAR: 8, 9  Newborn Delivery   Birth date/time: 10/10/2019 04:16:00 Delivery type: Vaginal, Spontaneous     Baby name: Blessen Delivering provider: CRUMPLER, JENNIFER B  Episiotomy:None   Lacerations:2nd degree;Perineal   S:  Sitting in bed, baby in arms sleeping, min cramping, (+) voids, small bleed, denies HA/NV/dizziness, denies chills.   O:   Vitals:   10/10/19 0511 10/10/19 0600 10/10/19 0615 10/10/19 0715  BP: (!) 126/91  118/70 120/77  Pulse: (!) 114  (!) 108 (!) 114  Resp: 16  17 18   Temp:  100.1 F (37.8 C) (!) 101.3 F (38.5 C) 100 F (37.8 C)  TempSrc:  Oral Oral Oral  SpO2:   100% 100%  Weight:      Height:         AAO x 3, NAD  FF bellow U, non-tender  Small lochia  A / P:    Principal Problem:   Postpartum care following vaginal delivery 3/30 Active Problems:   Indication for care in labor or delivery   SVD (spontaneous vaginal delivery)   Perineal laceration, second degree  PPD #0  Stable post partum  Routine PP orders Maternal temp and mild tachy in labpr persisting postpartum  - rcvd misoprostol PP for uterine atony, temp may be 2/2 to this but will monitor closely for developing endometritis  - rpt temp check now, recc IV ABX and UCX if persisting pyuria.   4/30, MSN  10/10/2019 1:03 PM

## 2019-10-10 NOTE — Anesthesia Postprocedure Evaluation (Signed)
Anesthesia Post Note  Patient: Jillian Walter  Procedure(s) Performed: AN AD HOC LABOR EPIDURAL     Patient location during evaluation: Mother Baby Anesthesia Type: Epidural Level of consciousness: awake and alert Pain management: pain level controlled Vital Signs Assessment: post-procedure vital signs reviewed and stable Respiratory status: spontaneous breathing, nonlabored ventilation and respiratory function stable Cardiovascular status: stable Postop Assessment: no headache, no backache and epidural receding Anesthetic complications: no    Last Vitals:  Vitals:   10/10/19 1116 10/10/19 1500  BP: 126/82 121/65  Pulse: (!) 104 92  Resp: 17 16  Temp: 37.2 C 37.1 C  SpO2: 99% 100%    Last Pain:  Vitals:   10/10/19 1500  TempSrc: Oral  PainSc: Asleep   Pain Goal: Patients Stated Pain Goal: 3 (10/09/19 2143)              Epidural/Spinal Function Cutaneous sensation: Normal sensation (10/10/19 1500)  Delancey Moraes

## 2019-10-11 LAB — SURGICAL PATHOLOGY

## 2019-10-11 MED ORDER — IBUPROFEN 600 MG PO TABS: 600.0000 mg | ORAL_TABLET | Freq: Four times a day (QID) | ORAL | 0 refills | Status: DC

## 2019-10-11 NOTE — Progress Notes (Signed)
Subjective: Postpartum Day #1 : S/P NSVD due to spontaneous labor. Patient up ad lib, denies syncope or dizziness. Reports consuming regular diet without issues and denies N/V. Denies issues with urination and reports bleeding is "fine."  Patient is breastfeeding and reports going well.  Declines postpartum contraception.  Pain is being appropriately managed with use of Motrin. Pt desired discharge home but baby requires bili lights overnight and now pt desires to stay overnight.   2nd degree laceration Feeding:  Breast Contraceptive plan:  declines  Objective: Vital signs in last 24 hours: Patient Vitals for the past 24 hrs:  BP Temp Temp src Pulse Resp SpO2  10/11/19 0551 125/78 98.6 F (37 C) Oral 96 18 99 %  10/10/19 2146 116/79 97.9 F (36.6 C) Oral 96 18 98 %    Physical Exam:  General: alert and cooperative Mood/Affect: appropriate Lungs: clear to auscultation, no wheezes, rales or rhonchi, symmetric air entry.  Heart: normal rate, regular rhythm, normal S1, S2, no murmurs, rubs, clicks or gallops. Breast: breasts appear normal, no suspicious masses, no skin or nipple changes or axillary nodes. Abdomen:  + bowel sounds, soft, non-tender GU: healing well. No signs of external hematomas.  Uterine Fundus: firm Lochia: appropriate Skin: Warm, Dry. DVT Evaluation: No evidence of DVT seen on physical exam.  CBC Latest Ref Rng & Units 10/10/2019 10/09/2019 02/13/2019  WBC 4.0 - 10.5 K/uL 20.4(H) 13.7(H) 7.0  Hemoglobin 12.0 - 15.0 g/dL 9.4(T) 11.7(L) 12.8  Hematocrit 36.0 - 46.0 % 27.5(L) 34.3(L) 37.2  Platelets 150 - 400 K/uL 197 244 211   No results found for this or any previous visit (from the past 24 hour(s)).   I/O last 3 completed shifts: In: -  Out: 1050 [Urine:500; Blood:550]   Assessment Postpartum Day # 1 : S/P NSVD. Pt stable. Appropriate involution. Breastfeeding. Hemodynamically stable.   Plan: Will stay overnight due to baby being under bili lights Continue  other mgmt as ordered VTE prophylactics: Early ambulated as tolerates.  Pain control: Motrin/Tylenol PRN Anticipate discharge tomorrow.  Dr. Sallye Ober to be updated on patient status  June Leap NP-C, CNM 10/11/2019, 5:55 PM

## 2019-10-11 NOTE — Discharge Summary (Signed)
NSVD OB Discharge Summary    Patient Name: Jillian Walter DOB: 1985-09-23 MRN: [id number]  Date of admission: 10/09/2019 Delivering MD: Verdis Prime B  Date of delivery: 10/10/2019 Type of delivery: NSVD  Newborn Data: Sex: Baby girl  Name: ??? Live born female  Birth Weight: 7 lb 11.5 oz (3501 g) APGAR: 8, 9  Newborn Delivery   Birth date/time: 10/10/2019 04:16:00 Delivery type: Vaginal, Spontaneous    Feeding: breast Infant being discharge to home with mother in stable condition.   Admitting diagnosis: Indication for care in labor or delivery [O75.9] Intrauterine pregnancy: [redacted]w[redacted]d     Secondary diagnosis:  Principal Problem:   Postpartum care following vaginal delivery 3/30 Active Problems:   Indication for care in labor or delivery   SVD (spontaneous vaginal delivery)   Perineal laceration, second degree                               Complications: None                                                              Intrapartum Procedures: spontaneous vaginal delivery Postpartum Procedures: none Complications-Operative and Postpartum: none   History of Present Illness: Jillian Walter is a 34 y.o. female, G1P1001, who presents at [redacted]w[redacted]d weeks gestation. The patient has been followed at  [facility] Gyne[facility]  Her pregnancy has been complicated by:  Patient Active Problem List   Diagnosis Date Noted  . SVD (spontaneous vaginal delivery) 10/10/2019  . Postpartum care following vaginal delivery 3/30 10/10/2019  . Perineal laceration, second degree 10/10/2019  . Indication for care in labor or delivery 10/09/2019  . Traumatic injury during pregnancy in third trimester 09/22/2019  . Dichorionic diamniotic twin pregnancy in first trimester 02/13/2019  . ACL TEAR, RIGHT KNEE 05/16/2008  . TOBACCO ABUSE 05/10/2008  . KNEE INJURY, RIGHT 05/10/2008   Hospital course:  Onset of Labor With Vaginal Delivery     34 y.o. yo G1P1001 at [redacted]w[redacted]d was admitted  in Active Labor on 10/09/2019. Patient had an uncomplicated labor course as follows:  Membrane Rupture Time/Date: 8:00 PM ,10/09/2019   Intrapartum Procedures: Episiotomy: None [1]                                         Lacerations:  2nd degree [3];Perineal [11]  Patient had a delivery of a Viable infant. 10/10/2019  Information for the patient's newborn:  Dorean, Hiebert [[id number]]  Delivery Method: Vaginal, Spontaneous(Filed from Delivery Summary)   Patient had an uncomplicated postpartum course.  She is ambulating, tolerating a regular diet, passing flatus, and urinating well. Patient is discharged home in stable condition on 10/11/19.  Physical exam  Vitals:   10/10/19 1116 10/10/19 1500 10/10/19 2146 10/11/19 0551  BP: 126/82 121/65 116/79 125/78  Pulse: (!) 104 92 96 96  Resp: 17 16 18 18   Temp: 98.9 F (37.2 C) 98.7 F (37.1 C) 97.9 F (36.6 C) 98.6 F (37 C)  TempSrc: Oral Oral Oral Oral  SpO2: 99% 100% 98% 99%  Weight:      Height:  General: alert and cooperative Lochia: appropriate Uterine Fundus: firm Perineum: healing DVT Evaluation: No evidence of DVT seen on physical exam.  Labs: Lab Results  Component Value Date   WBC 20.4 (H) 10/10/2019   HGB 8.9 (L) 10/10/2019   HCT 27.5 (L) 10/10/2019   MCV 92.3 10/10/2019   PLT 197 10/10/2019   No flowsheet data found.  Date of discharge: 10/11/2019 Discharge Diagnoses: Term Pregnancy-delivered Discharge instruction: per After Visit Summary and "Baby and Me Booklet".  After visit meds:   Activity:           unrestricted Advance as tolerated. Pelvic rest for 6 weeks.  Diet:                routine Medications: Ibuprofen Postpartum contraception: None Condition:  Pt discharge to home with baby in stable condition  Meds: Allergies as of 10/11/2019      Reactions   Latex Itching      Medication List    TAKE these medications   ibuprofen 600 MG tablet Commonly known as: ADVIL Take 1 tablet (600 mg  total) by mouth every 6 (six) hours.      Discharge Follow Up:  Follow-up [facility]. Schedule an appointment as soon as possible for a visit in 6 week(s).   Specialty: Obstetrics and Gynecology Contact information: [address] [place] [address] [phone]          10/11/2019, 12:49 PM  Suzan Nailer, CNM

## 2019-10-11 NOTE — Lactation Note (Signed)
This note was copied from a baby's chart. Lactation Consultation Note Baby is 63 hrs old. Has been fussy since birth per mom. Baby will get on breast, suckle a few times then come off crying.  Baby has high palate, labial frenulum.  Mom can easily express colostrum. Spoon fed baby 1 ml. Gave mom colostrum container to collect colostrum and spoon feed. Mom had given formula d/t baby being so fussy. Encouraged "C" hold when latching, occasionally breast massage towards nipple. Mom very compressible. Newborn behavior, STS, I&O, milk storage, cluster feeding, supply and demand. Mom has shells but isn't wearing them. Encouraged to pre-pump to evert nipple or finger stimulation. Baby latches then pulls off. Baby finally latched before LC left. Mom is getting frustrated. Encouraged mom to try to rest when baby does. Mom has no support person at bedside at this time. Encouraged STS, call for assistance if needed.   Patient Name: Jillian Walter [id number] Date: 10/11/2019 Reason for consult: Mother's request;Difficult latch;Primapara;Term   Maternal Data    Feeding Feeding Type: Breast Milk  LATCH Score Latch: Repeated attempts needed to sustain latch, nipple held in mouth throughout feeding, stimulation needed to elicit sucking reflex.  Audible Swallowing: None  Type of Nipple: Flat  Comfort (Breast/Nipple): Soft / non-tender  Hold (Positioning): Assistance needed to correctly position infant at breast and maintain latch.  LATCH Score: 5  Interventions Interventions: Breast feeding basics reviewed;Support pillows;Assisted with latch;Position options;Skin to skin;Expressed milk;Breast massage;Hand express;Shells;Breast compression;Pre-pump if needed;Adjust position;Hand pump  Lactation Tools Discussed/Used Tools: Shells;Pump Shell Type: Inverted Breast pump type: Manual   Consult Status Consult Status: Follow-up Date: 10/11/19 Follow-up type: In-patient    Fidencio Duddy,  Diamond Nickel 10/11/2019, 5:11 AM

## 2019-10-12 NOTE — Lactation Note (Signed)
This note was copied from a baby's chart. Lactation Consultation Note  Patient Name: Jillian Walter [id number] Date: 10/12/2019   Baby 55 hours old and sleeping on phototherapy.   Mother has been pumping 15-17 ml and giving the difference w/ formula. Mother did latch baby for 5 min this morning. Encouraged her to put baby to the breast first and then supplement. Mother has 2 - DEBP at home.Reviewed engorgement care and monitoring voids/stools. Feed on demand with cues.  Goal 8-12+ times per day after first 24 hrs.  Place baby STS if not cueing.       Maternal Data    Feeding Feeding Type: Bottle Fed - Formula Nipple Type: Slow - flow  LATCH Score Latch: Repeated attempts needed to sustain latch, nipple held in mouth throughout feeding, stimulation needed to elicit sucking reflex.  Audible Swallowing: Spontaneous and intermittent  Type of Nipple: Everted at rest and after stimulation  Comfort (Breast/Nipple): Soft / non-tender  Hold (Positioning): Assistance needed to correctly position infant at breast and maintain latch.  LATCH Score: 8  Interventions Interventions: Assisted with latch;Skin to skin;Hand express  Lactation Tools Discussed/Used     Consult Status      [name] [facility] 10/12/2019, 11:26 AM

## 2019-10-12 NOTE — Lactation Note (Signed)
This note was copied from a baby's chart. Lactation Consultation Note F/u w/mom on difficulty latching.  Mom has decided to pump and bottle feed. Mom stated its less stressful. Mom is able to pump colostrum and is excited. Mom is supplementing w/formula Gerber. Mom was resting when LC entered room. Mom stated "hello". LC introduced self. Mom stated to come in. FOB awake. Baby sleeping. Mom stated it wasn't time for baby to eat yet. Mom is feeling much better about the way feeding her baby is going now. Mom has DEBP at home. Encouraged mom to rest. Lactation will f/u w/mom before d/c home. Call if has questions or needs assistance before then.  Patient Name: Jillian Walter [id number] Date: 10/12/2019 Reason for consult: Follow-up assessment;Difficult latch;Primapara;Term   Maternal Data    Feeding Feeding Type: Bottle Fed - Breast Milk  LATCH Score                   Interventions    Lactation Tools Discussed/Used Breast pump type: Double-Electric Breast Pump   Consult Status Consult Status: Follow-up Date: 10/12/19 Follow-up type: In-patient    Charyl Dancer 10/12/2019, 3:35 AM

## 2019-10-12 NOTE — Discharge Summary (Signed)
NSVD OB Discharge Summary    Patient Name: Jillian Walter DOB: Sep 09, 1985 MRN: [id number]  Date of admission: 10/09/2019 Delivering MD: Verdis Prime B  Date of delivery: 10/10/2019 Type of delivery: NSVD  Newborn Data: Sex: Baby girl  Live born female  Birth Weight: 7 lb 11.5 oz (3501 g) APGAR: 8, 9  Newborn Delivery   Birth date/time: 10/10/2019 04:16:00 Delivery type: Vaginal, Spontaneous    Feeding: breast Infant being discharge to home with mother in stable condition.   Admitting diagnosis: Indication for care in labor or delivery [O75.9] Intrauterine pregnancy: [redacted]w[redacted]d     Secondary diagnosis:  Principal Problem:   Postpartum care following vaginal delivery 3/30 Active Problems:   Indication for care in labor or delivery   SVD (spontaneous vaginal delivery)   Perineal laceration, second degree                               Complications: None                                                              Intrapartum Procedures: spontaneous vaginal delivery Postpartum Procedures: none Complications-Operative and Postpartum: none   History of Present Illness: Ms. Jillian Walter is a 34 y.o. female, G1P1001, who presents at [redacted]w[redacted]d weeks gestation. The patient has been followed at  [facility] Gyne[facility]  Her pregnancy has been complicated by:  Patient Active Problem List   Diagnosis Date Noted  . SVD (spontaneous vaginal delivery) 10/10/2019  . Postpartum care following vaginal delivery 3/30 10/10/2019  . Perineal laceration, second degree 10/10/2019  . Indication for care in labor or delivery 10/09/2019  . Traumatic injury during pregnancy in third trimester 09/22/2019  . Dichorionic diamniotic twin pregnancy in first trimester 02/13/2019  . ACL TEAR, RIGHT KNEE 05/16/2008  . TOBACCO ABUSE 05/10/2008  . KNEE INJURY, RIGHT 05/10/2008   Hospital course:  Onset of Labor With Vaginal Delivery     34 y.o. yo G1P1001 at [redacted]w[redacted]d was admitted in Active  Labor on 10/09/2019. Patient had an uncomplicated labor course as follows:  Membrane Rupture Time/Date: 8:00 PM ,10/09/2019   Intrapartum Procedures: Episiotomy: None [1]                                         Lacerations:  2nd degree [3];Perineal [11]  Patient had a delivery of a Viable infant. 10/10/2019  Information for the patient's newborn:  Jillian, Walter [[id number]]  Delivery Method: Vaginal, Spontaneous(Filed from Delivery Summary)   Patient had an uncomplicated postpartum course.  She is ambulating, tolerating a regular diet, passing flatus, and urinating well. Patient is discharged home in stable condition on 10/12/19.  Physical exam  Vitals:   10/11/19 0551 10/11/19 2157 10/12/19 0540 10/12/19 0639  BP: 125/78 138/90 127/82 128/82  Pulse: 96 (!) 102 90 96  Resp: 18  16   Temp: 98.6 F (37 C) 98.9 F (37.2 C) 98 F (36.7 C) 98 F (36.7 C)  TempSrc: Oral Oral Oral   SpO2: 99% 99% 98% 100%  Weight:      Height:  General: alert and cooperative Lochia: appropriate Uterine Fundus: firm Perineum: healing DVT Evaluation: No evidence of DVT seen on physical exam.  Labs: Lab Results  Component Value Date   WBC 20.4 (H) 10/10/2019   HGB 8.9 (L) 10/10/2019   HCT 27.5 (L) 10/10/2019   MCV 92.3 10/10/2019   PLT 197 10/10/2019   No flowsheet data found.  Date of discharge: 10/12/2019 Discharge Diagnoses: Term Pregnancy-delivered Discharge instruction: per After Visit Summary and "Baby and Me Booklet".  After visit meds:   Activity:           unrestricted Advance as tolerated. Pelvic rest for 6 weeks.  Diet:                routine Medications: Ibuprofen Postpartum contraception: None Condition:  Pt discharge to home with baby in stable condition  Meds: Allergies as of 10/12/2019      Reactions   Latex Itching      Medication List    TAKE these medications   ibuprofen 600 MG tablet Commonly known as: ADVIL Take 1 tablet (600 mg total) by mouth every 6  (six) hours.      Discharge Follow Up:  Follow-up [facility]. Schedule an appointment as soon as possible for a visit in 6 week(s).   Specialty: Obstetrics and Gynecology Contact information: [address] [place] [address] [phone]          10/12/2019, 9:59 AM  Noralyn Pick, FNP

## 2020-01-04 ENCOUNTER — Other Ambulatory Visit: Payer: Self-pay

## 2020-01-04 ENCOUNTER — Emergency Department (HOSPITAL_COMMUNITY)
Admission: EM | Admit: 2020-01-04 | Discharge: 2020-01-04 | Disposition: A | Discharge status: Home / Self Care | Payer: Medicaid Other | Attending: Emergency Medicine | Admitting: Emergency Medicine

## 2020-01-04 ENCOUNTER — Encounter (HOSPITAL_COMMUNITY): Payer: Self-pay | Admitting: *Deleted

## 2020-01-04 DIAGNOSIS — R0789 Other chest pain: Secondary | ICD-10-CM | POA: Diagnosis present

## 2020-01-04 DIAGNOSIS — Z5321 Procedure and treatment not carried out due to patient leaving prior to being seen by health care provider: Secondary | ICD-10-CM | POA: Diagnosis not present

## 2020-01-04 LAB — COMPREHENSIVE METABOLIC PANEL
ALT: 26 U/L (ref 0–44)
AST: 24 U/L (ref 15–41)
Albumin: 4.1 g/dL (ref 3.5–5.0)
Alkaline Phosphatase: 68 U/L (ref 38–126)
Anion gap: 9 (ref 5–15)
BUN: 10 mg/dL (ref 6–20)
CO2: 25 mmol/L (ref 22–32)
Calcium: 9.3 mg/dL (ref 8.9–10.3)
Chloride: 104 mmol/L (ref 98–111)
Creatinine, Ser: 1 mg/dL (ref 0.44–1.00)
GFR calc Af Amer: >60 mL/min (ref >60)
GFR calc non Af Amer: >60 mL/min (ref >60)
Glucose, Bld: 106 mg/dL — ABNORMAL HIGH (ref 70–99)
Potassium: 3.7 mmol/L (ref 3.5–5.1)
Sodium: 138 mmol/L (ref 135–145)
Total Bilirubin: 0.8 mg/dL (ref 0.3–1.2)
Total Protein: 7.5 g/dL (ref 6.5–8.1)

## 2020-01-04 LAB — CBC
HCT: 41.1 % (ref 36.0–46.0)
Hemoglobin: 12.4 g/dL (ref 12.0–15.0)
MCH: 23.2 pg — ABNORMAL LOW (ref 26.0–34.0)
MCHC: 30.2 g/dL (ref 30.0–36.0)
MCV: 76.8 fL — ABNORMAL LOW (ref 80.0–100.0)
Platelets: 277 10*3/uL (ref 150–400)
RBC: 5.35 MIL/uL — ABNORMAL HIGH (ref 3.87–5.11)
RDW: 19.3 % — ABNORMAL HIGH (ref 11.5–15.5)
WBC: 9.3 10*3/uL (ref 4.0–10.5)
nRBC: 0 % (ref 0.0–0.2)

## 2020-01-04 LAB — I-STAT BETA HCG BLOOD, ED (MC, WL, AP ONLY): I-stat hCG, quantitative: <5 m[IU]/mL (ref <5)

## 2020-01-04 LAB — LIPASE, BLOOD: Lipase: 33 U/L (ref 11–51)

## 2020-01-04 MED ORDER — ONDANSETRON 4 MG PO TBDP
4.0000 mg | ORAL_TABLET | Freq: Once | ORAL | Status: AC | PRN
  Administered 2020-01-04: 4 mg via ORAL
  Filled 2020-01-04: qty 1

## 2020-01-04 MED ORDER — SODIUM CHLORIDE 0.9% FLUSH: 3.0000 mL | Freq: Once | INTRAVENOUS | Status: DC

## 2020-01-04 NOTE — ED Notes (Signed)
Pt decided to leave the emergency department 

## 2020-01-04 NOTE — ED Triage Notes (Signed)
Pt reports intially had onset of chest discomfort but then became upper mid abd pain with n/v.

## 2021-05-16 IMAGING — US OBSTETRIC <14 WK US AND TRANSVAGINAL OB US
1 series · 15 of 28 positions shown · non-contrast
Comparison: None.

CLINICAL DATA: Vaginal bleeding and cramping beginning today.
Gestational age by LMP of 6 weeks 1 day.

EXAM:
TWIN OBSTETRIC <14WK US AND TRANSVAGINAL OB US

[Series 1: obstetric <14 wk us and transvaginal ob us · 47 acquisitions, 15 frames shown]
[im 1/47]
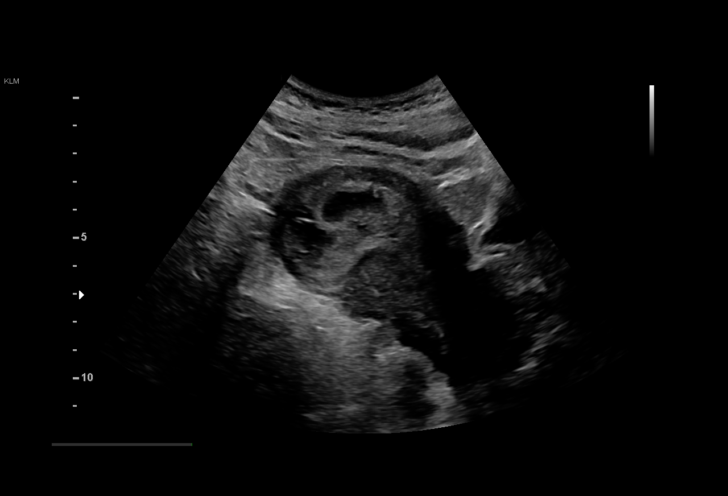
[im 4/47]
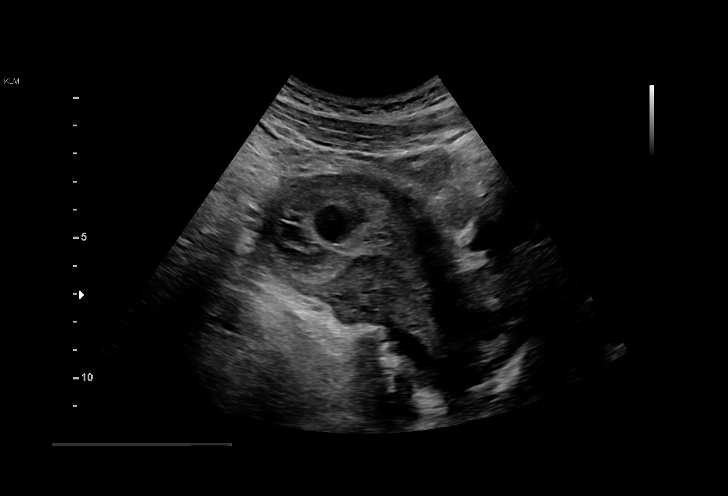
[im 7/47]
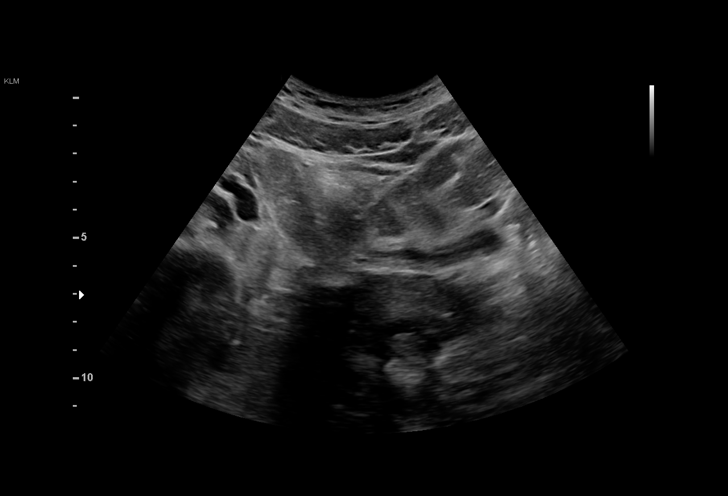
[im 11/47]
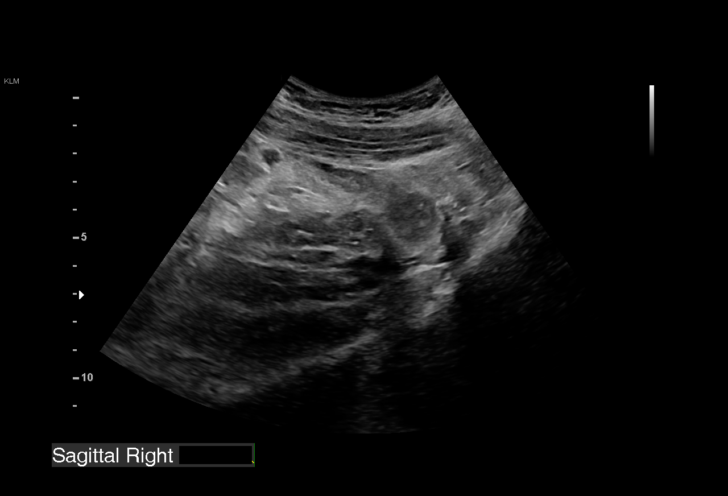
[im 14/47]
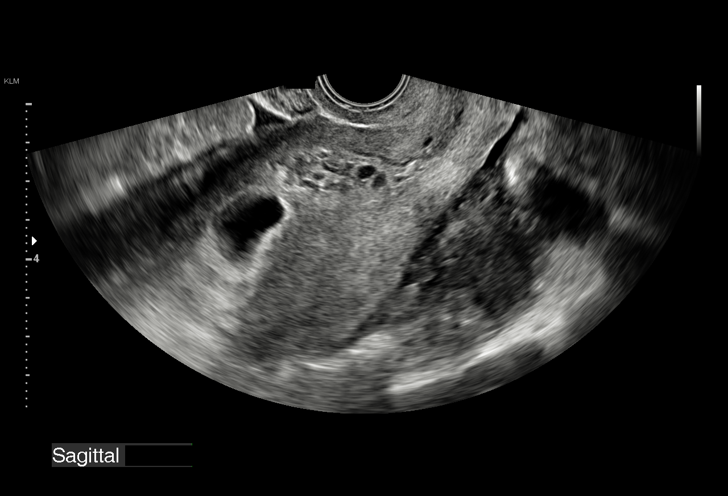
[im 18/47]
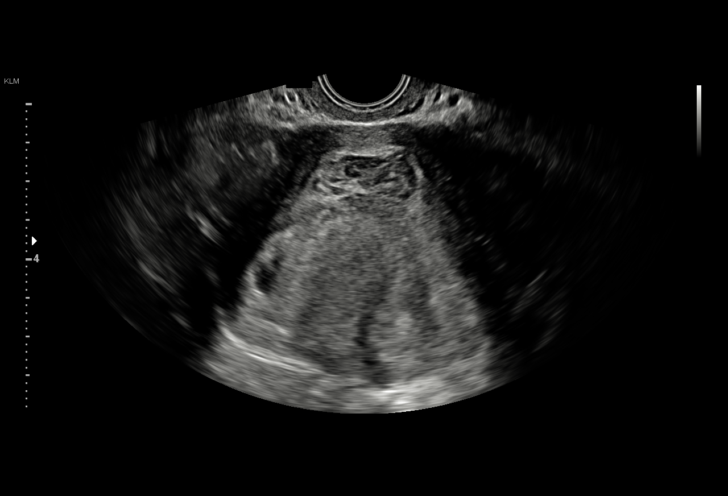
[im 21/47]
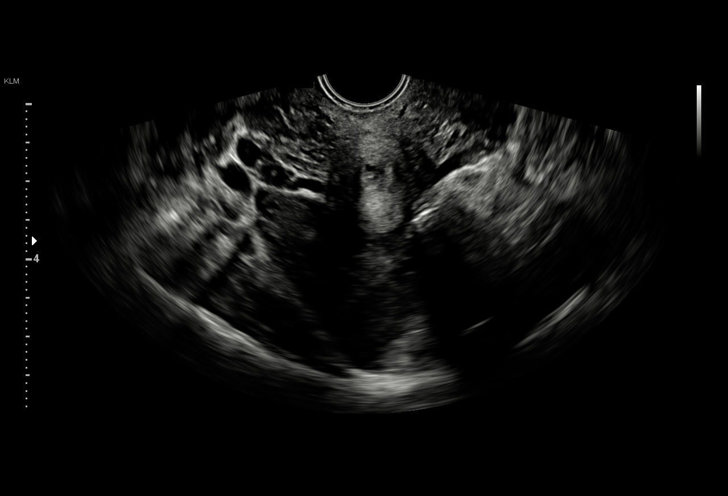
[im 24/47]
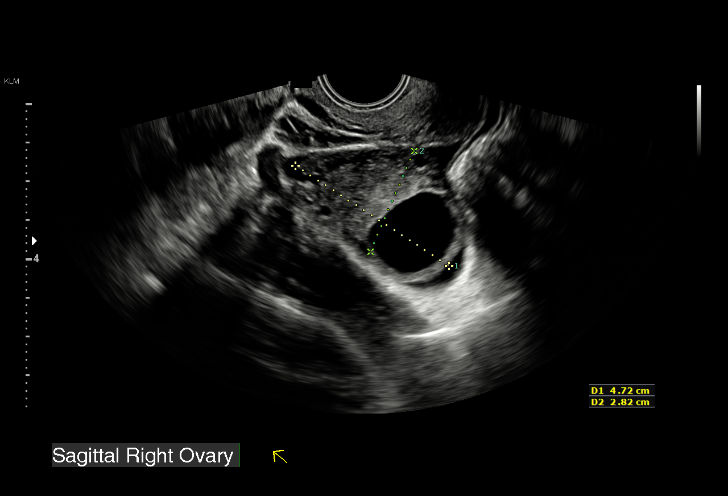
[im 26/47]
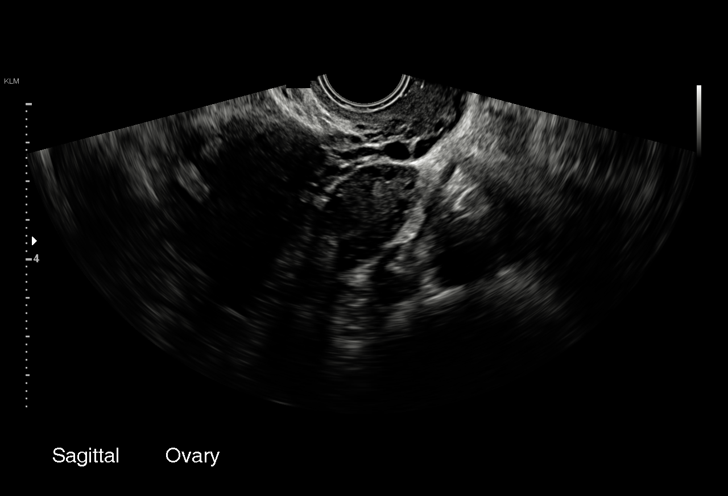
[im 29/47]
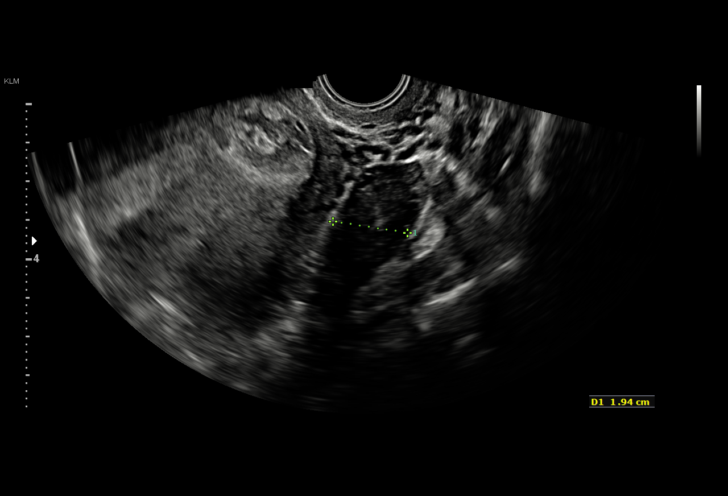
[im 33/47]
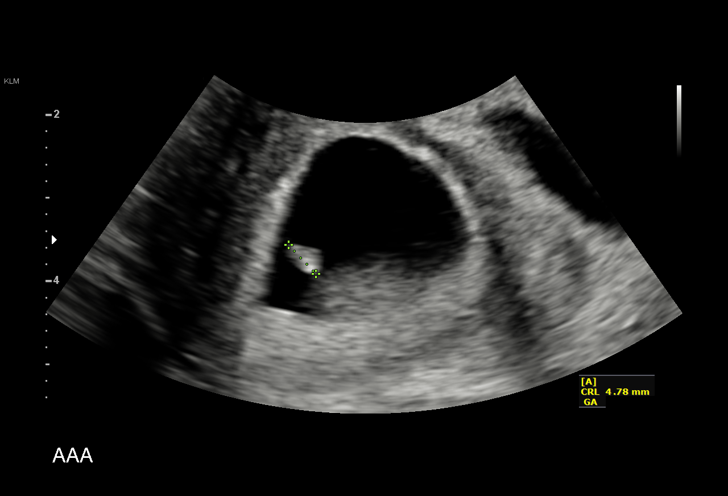
[im 36/47]
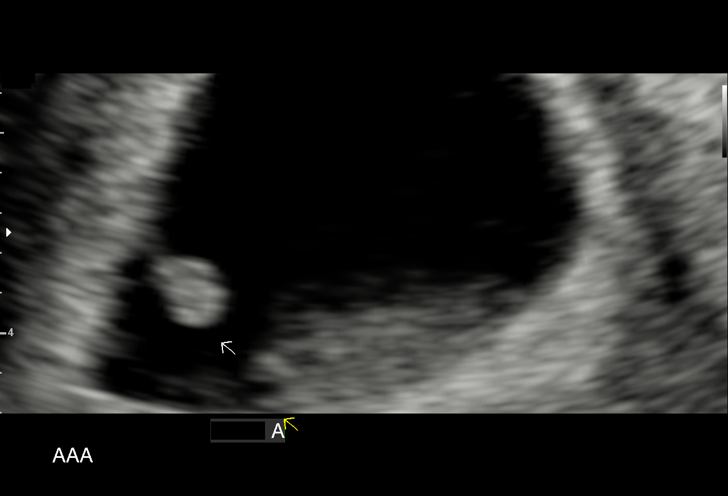
[im 40/47]
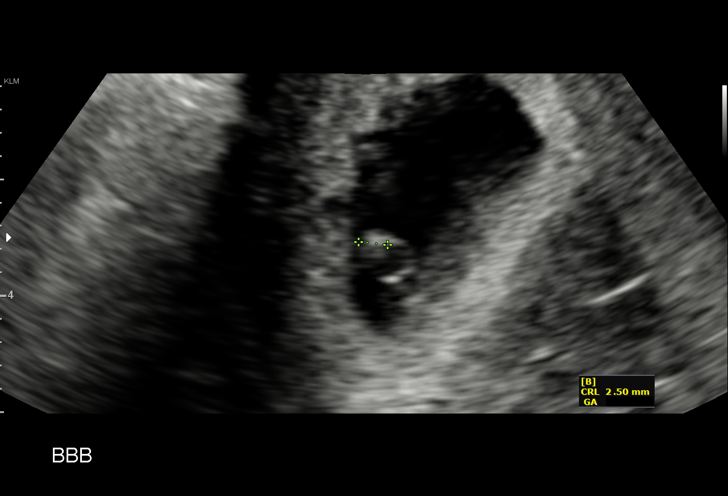
[im 43/47]
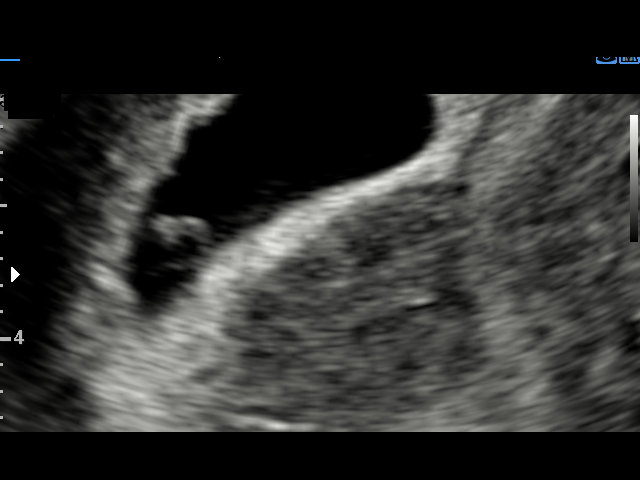
[im 47/47]
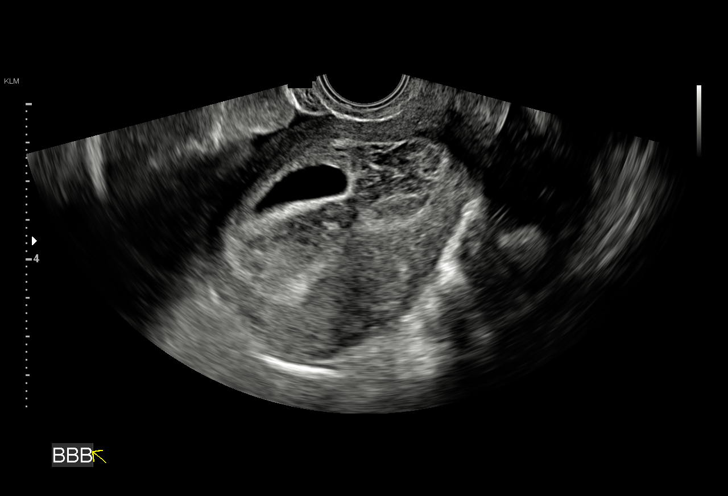

[15 of 28 positions shown; findings below may reference images not displayed]

FINDINGS: Number of IUPs:  2

Chorionicity/Amnionicity:  Dichorionic-diamniotic (thick membrane)

TWIN 1

Yolk sac:  Not Visualized.

Embryo:  Visualized.

Cardiac Activity: Visualized.

Heart Rate: 70 bpm

CRL:  5 mm   6 w 1 d                  US EDC: 10/08/2019

TWIN 2

Yolk sac:  Visualized.

Embryo:  Visualized.

Cardiac Activity: Visualized.

Heart Rate: 96 bpm

CRL:  3 mm   5 w 5 d                  US EDC: 10/11/2019

Subchorionic hemorrhage:  Moderate subchorionic hemorrhage is noted.

Maternal uterus/adnexae: Both ovaries are normal in appearance. No
mass or abnormal free fluid identified.
IMPRESSION: Living dichorionic twin IUP, with estimated gestational age of 6
weeks 0 days, and US EDC of 10/09/2019.

Embryonic bradycardia and moderate subchorionic hemorrhage noted,
which are poor prognostic signs. Suggest correlation with serial
b-hCG levels, and consider followup ultrasound to assess viability
in 7 days.

## 2022-04-30 ENCOUNTER — Other Ambulatory Visit: Payer: Self-pay

## 2023-03-07 ENCOUNTER — Encounter (HOSPITAL_COMMUNITY): Payer: Self-pay | Admitting: Emergency Medicine

## 2023-03-07 ENCOUNTER — Emergency Department (HOSPITAL_COMMUNITY): Admission: EM | Admit: 2023-03-07 | Payer: Medicaid Other | Source: Home / Self Care

## 2023-03-07 ENCOUNTER — Emergency Department (HOSPITAL_COMMUNITY): Payer: Medicaid Other

## 2023-03-07 ENCOUNTER — Other Ambulatory Visit: Payer: Self-pay

## 2023-03-07 DIAGNOSIS — R519 Headache, unspecified: Secondary | ICD-10-CM | POA: Diagnosis not present

## 2023-03-07 DIAGNOSIS — Z79899 Other long term (current) drug therapy: Secondary | ICD-10-CM | POA: Diagnosis not present

## 2023-03-07 DIAGNOSIS — M25519 Pain in unspecified shoulder: Secondary | ICD-10-CM | POA: Diagnosis not present

## 2023-03-07 DIAGNOSIS — F17200 Nicotine dependence, unspecified, uncomplicated: Secondary | ICD-10-CM | POA: Diagnosis not present

## 2023-03-07 DIAGNOSIS — Z91040 Latex allergy status: Secondary | ICD-10-CM | POA: Insufficient documentation

## 2023-03-07 DIAGNOSIS — I10 Essential (primary) hypertension: Secondary | ICD-10-CM | POA: Diagnosis not present

## 2023-03-07 DIAGNOSIS — H538 Other visual disturbances: Secondary | ICD-10-CM | POA: Insufficient documentation

## 2023-03-07 LAB — BASIC METABOLIC PANEL
Anion gap: 8 (ref 5–15)
BUN: 10 mg/dL (ref 6–20)
CO2: 29 mmol/L (ref 22–32)
Calcium: 8.8 mg/dL — ABNORMAL LOW (ref 8.9–10.3)
Chloride: 101 mmol/L (ref 98–111)
Creatinine, Ser: 0.87 mg/dL (ref 0.44–1.00)
GFR, Estimated: >60 mL/min (ref >60)
Glucose, Bld: 90 mg/dL (ref 70–99)
Potassium: 3.6 mmol/L (ref 3.5–5.1)
Sodium: 138 mmol/L (ref 135–145)

## 2023-03-07 LAB — CBC
HCT: 35 % — ABNORMAL LOW (ref 36.0–46.0)
Hemoglobin: 10.5 g/dL — ABNORMAL LOW (ref 12.0–15.0)
MCH: 22.2 pg — ABNORMAL LOW (ref 26.0–34.0)
MCHC: 30 g/dL (ref 30.0–36.0)
MCV: 73.8 fL — ABNORMAL LOW (ref 80.0–100.0)
Platelets: 194 10*3/uL (ref 150–400)
RBC: 4.74 MIL/uL (ref 3.87–5.11)
RDW: 23.8 % — ABNORMAL HIGH (ref 11.5–15.5)
WBC: 5.2 10*3/uL (ref 4.0–10.5)
nRBC: 0 % (ref 0.0–0.2)

## 2023-03-07 LAB — HCG, SERUM, QUALITATIVE: Preg, Serum: NEGATIVE

## 2023-03-07 LAB — TROPONIN I (HIGH SENSITIVITY): Troponin I (High Sensitivity): 3 ng/L (ref <18)

## 2023-03-07 MED ORDER — KETOROLAC TROMETHAMINE 30 MG/ML IJ SOLN
30.0000 mg | Freq: Once | INTRAMUSCULAR | Status: AC
  Administered 2023-03-07: 30 mg via INTRAMUSCULAR
  Filled 2023-03-07: qty 1

## 2023-03-07 MED ORDER — LORAZEPAM 1 MG PO TABS
0.5000 mg | ORAL_TABLET | Freq: Once | ORAL | Status: AC
  Administered 2023-03-07: 0.5 mg via ORAL
  Filled 2023-03-07: qty 1

## 2023-03-07 MED ORDER — DEXAMETHASONE SODIUM PHOSPHATE 10 MG/ML IJ SOLN
10.0000 mg | Freq: Once | INTRAMUSCULAR | Status: AC
  Administered 2023-03-07: 10 mg via INTRAMUSCULAR
  Filled 2023-03-07: qty 1

## 2023-03-07 NOTE — ED Provider Notes (Incomplete)
[place] EMERGENCY DEPARTMENT AT [facility] Provider Note   CSN: [id number] Arrival date & time: 03/07/23  2110     History {Add pertinent medical, surgical, social history, OB history to HPI:1} Chief Complaint  Patient presents with  . Blurred Vision  . Shoulder Pain  . Headache    Jillian Walter is a 37 y.o. female.  Patient presents to the emergency department with a left-sided headache which has been ongoing for the past 4 to 5 days.  She states that over the past 2 hours she has noticed some mild blurriness in her left eye.  She denies nausea, vomiting, fever, neck stiffness, neck pain.  Past medical history significant for tobacco abuse, polycystic ovarian disease  HPI     Home Medications Prior to Admission medications   Medication Sig Start Date End Date Taking? Authorizing Provider  ibuprofen (ADVIL) 600 MG tablet Take 1 tablet (600 mg total) by mouth every 6 (six) hours. 10/11/19   Hoover Browns, MD      Allergies    Latex    Review of Systems   Review of Systems  Physical Exam Updated Vital Signs BP (!) 165/114 (BP Location: Right Arm)   Pulse 85   Temp 98.8 F (37.1 C)   Resp 16   Ht 5\' 4"  (1.626 m)   Wt 80.7 kg   LMP 03/06/2023   SpO2 94%   BMI 30.55 kg/m  Physical Exam  ED Results / Procedures / Treatments   Labs (all labs ordered are listed, but only abnormal results are displayed) Labs Reviewed  BASIC METABOLIC PANEL - Abnormal; Notable for the following components:      Result Value   Calcium 8.8 (*)    All other components within normal limits  CBC - Abnormal; Notable for the following components:   Hemoglobin 10.5 (*)    HCT 35.0 (*)    MCV 73.8 (*)    MCH 22.2 (*)    RDW 23.8 (*)    All other components within normal limits  HCG, SERUM, QUALITATIVE  TROPONIN I (HIGH SENSITIVITY)    EKG None  Radiology CT Head Wo Contrast  Result Date: 03/07/2023 CLINICAL DATA:  Headache. EXAM: CT HEAD WITHOUT CONTRAST TECHNIQUE:  Contiguous axial images were obtained from the base of the skull through the vertex without intravenous contrast. RADIATION DOSE REDUCTION: This exam was performed according to the departmental dose-optimization program which includes automated exposure control, adjustment of the mA and/or kV according to patient size and/or use of iterative reconstruction technique. COMPARISON:  None Available. FINDINGS: Brain: No evidence of acute infarction, hemorrhage, hydrocephalus, extra-axial collection or mass lesion/mass effect. Vascular: No hyperdense vessel or unexpected calcification. Skull: Normal. Negative for fracture or focal lesion. Sinuses/Orbits: There is mild bilateral ethmoid sinus mucosal thickening. Other: None. IMPRESSION: 1. No acute intracranial pathology. 2. Mild bilateral ethmoid sinus disease. Electronically Signed   By: Aram Candela M.D.   On: 03/07/2023 22:48   DG Chest 2 View  Result Date: 03/07/2023 CLINICAL DATA:  Congestion, headache and blurred vision. EXAM: CHEST - 2 VIEW COMPARISON:  December 30, 2014 FINDINGS: The heart size and mediastinal contours are within normal limits. Both lungs are clear. The visualized skeletal structures are unremarkable. IMPRESSION: No active cardiopulmonary disease. Electronically Signed   By: Aram Candela M.D.   On: 03/07/2023 22:06    Procedures Procedures  {Document cardiac monitor, telemetry assessment procedure when appropriate:1}  Medications Ordered in ED Medications  ketorolac (TORADOL)  30 MG/ML injection 30 mg (30 mg Intramuscular Given 03/07/23 2235)  dexamethasone (DECADRON) injection 10 mg (10 mg Intramuscular Given 03/07/23 2235)    ED Course/ Medical Decision Making/ A&P   {   Click here for ABCD2, HEART and other calculatorsREFRESH Note before signing :1}                              Medical Decision Making Amount and/or Complexity of Data Reviewed Labs: ordered. Radiology: ordered.  Risk Prescription drug  management.   ***  {Document critical care time when appropriate:1} {Document review of labs and clinical decision tools ie heart score, Chads2Vasc2 etc:1}  {Document your independent review of radiology images, and any outside records:1} {Document your discussion with family members, caretakers, and with consultants:1} {Document social determinants of health affecting pt's care:1} {Document your decision making why or why not admission, treatments were needed:1} Final Clinical Impression(s) / ED Diagnoses Final diagnoses:  None    Rx / DC Orders ED Discharge Orders     None

## 2023-03-07 NOTE — ED Notes (Signed)
Patient transported to CT scan . 

## 2023-03-07 NOTE — ED Provider Notes (Signed)
[place] EMERGENCY DEPARTMENT AT [facility] Provider Note   CSN: [id number] Arrival date & time: 03/07/23  2110     History  Chief Complaint  Patient presents with   Blurred Vision   Shoulder Pain   Headache    Jillian Walter is a 37 y.o. female.  Patient presents to the emergency department with a left-sided headache which has been ongoing for the past 4 to 5 days.  She states that over the past 2 hours she has noticed some mild blurriness in her left eye.  She denies nausea, vomiting, fever, neck stiffness, neck pain.  Past medical history significant for tobacco abuse, polycystic ovarian disease  HPI     Home Medications Prior to Admission medications   Medication Sig Start Date End Date Taking? Authorizing Provider  Cyanocobalamin (VITAMIN B-12 PO) Take by mouth daily. liquid   Yes [provider]  Ferrous Sulfate (IRON PO) Take 1 tablet by mouth daily.   Yes [provider]      Allergies    Latex    Review of Systems   Review of Systems  Physical Exam Updated Vital Signs BP (!) 150/104   Pulse 68   Temp 98.4 F (36.9 C) (Other (Comment))   Resp 18   Ht 5\' 4"  (1.626 m)   Wt 80.7 kg   LMP 03/06/2023   SpO2 100%   BMI 30.55 kg/m  Physical Exam Vitals and nursing note reviewed.  Constitutional:      General: She is not in acute distress.    Appearance: She is well-developed.  HENT:     Head: Normocephalic and atraumatic.  Eyes:     General: No scleral icterus.    Extraocular Movements: Extraocular movements intact.     Right eye: Normal extraocular motion and no nystagmus.     Left eye: Normal extraocular motion and no nystagmus.     Conjunctiva/sclera: Conjunctivae normal.     Pupils: Pupils are equal, round, and reactive to light. Pupils are equal.     Right eye: Pupil is round and reactive.     Left eye: Pupil is round and reactive.  Cardiovascular:     Rate and Rhythm: Normal rate and regular rhythm.     Heart  sounds: No murmur heard. Pulmonary:     Effort: Pulmonary effort is normal. No respiratory distress.     Breath sounds: Normal breath sounds.  Abdominal:     Palpations: Abdomen is soft.     Tenderness: There is no abdominal tenderness.  Musculoskeletal:        General: No swelling.     Cervical back: Neck supple.  Skin:    General: Skin is warm and dry.     Capillary Refill: Capillary refill takes less than 2 seconds.  Neurological:     Mental Status: She is alert.     Cranial Nerves: No dysarthria or facial asymmetry.     Comments: Patient with abnormal vision of left eye, diplopia, decreased peripheral vision of left eye through all fields  Psychiatric:        Mood and Affect: Mood normal.     ED Results / Procedures / Treatments   Labs (all labs ordered are listed, but only abnormal results are displayed) Labs Reviewed  BASIC METABOLIC PANEL - Abnormal; Notable for the following components:      Result Value   Calcium 8.8 (*)    All other components within normal limits  CBC -  Abnormal; Notable for the following components:   Hemoglobin 10.5 (*)    HCT 35.0 (*)    MCV 73.8 (*)    MCH 22.2 (*)    RDW 23.8 (*)    All other components within normal limits  HCG, SERUM, QUALITATIVE  TROPONIN I (HIGH SENSITIVITY)    EKG None  Radiology MR Brain W and Wo Contrast  Result Date: 03/08/2023 CLINICAL DATA:  Diplopia, vision abnormality in the left eye EXAM: MRI HEAD AND ORBITS WITHOUT AND WITH CONTRAST TECHNIQUE: Multiplanar, multiecho pulse sequences of the brain and surrounding structures were obtained without and with intravenous contrast. Multiplanar, multiecho pulse sequences of the orbits and surrounding structures were obtained including fat saturation techniques, before and after intravenous contrast administration. CONTRAST:  8mL GADAVIST GADOBUTROL 1 MMOL/ML IV SOLN COMPARISON:  No prior MRI available, correlation is made with CT head 03/07/2023 FINDINGS: MRI HEAD  FINDINGS Brain: No restricted diffusion to suggest acute or subacute infarct. No abnormal parenchymal or meningeal enhancement. No acute hemorrhage, mass, mass effect, or midline shift. No hydrocephalus or extra-axial collection. Normal craniocervical junction. 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 No hemosiderin deposition to suggest remote hemorrhage. Normal cerebral volume for age. Scattered T2 hyperintense foci in the bilateral frontal and parietal white matter, which are not radially oriented to the ventricles nor involving the corpus callosum. Vascular: Normal arterial flow voids. Normal arterial and venous enhancement Skull and upper cervical spine: Normal marrow signal. Other: The mastoids are well aerated. MRI ORBITS FINDINGS Orbits: Evaluation is somewhat limited by ocular motion. Within this limitation, no definite abnormal enhancement or increased T2 hyperintense signal. No traumatic or inflammatory finding. Globes, optic nerves, orbital fat, extraocular muscles, vascular structures, and lacrimal glands are normal. Visualized sinuses: Mucosal thickening in the ethmoid air cells and maxillary sinuses. Mucous retention cyst in the left maxillary sinus. Soft tissues: Negative. IMPRESSION: 1. No acute intracranial process. No evidence of acute or subacute infarct. No abnormal enhancement. 2. Scattered T2 hyperintense foci in the bilateral frontal and parietal white matter, which are not radially oriented to the ventricles nor involving the corpus callosum. These are nonspecific, but can be seen in the setting of migraines, prior trauma, or early chronic small vessel ischemic change. These are not in a pattern suggestive of demyelinating disease. 3. No abnormal signal or enhancement in the orbits. 4. 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 This could be further evaluated with a nonemergent MRI pituitary protocol. Electronically Signed   By: Wiliam Ke M.D.   On: 03/08/2023 02:20   MR ORBITS W WO CONTRAST  Result Date: 03/08/2023 CLINICAL DATA:  Diplopia, vision abnormality in the left eye EXAM: MRI HEAD AND ORBITS WITHOUT AND WITH CONTRAST TECHNIQUE: Multiplanar, multiecho pulse sequences of the brain and surrounding structures were obtained without and with intravenous contrast. Multiplanar, multiecho pulse sequences of the orbits and surrounding structures were obtained including fat saturation techniques, before and after intravenous contrast administration. CONTRAST:  8mL GADAVIST GADOBUTROL 1 MMOL/ML IV SOLN COMPARISON:  No prior MRI available, correlation is made with CT head 03/07/2023 FINDINGS: MRI HEAD FINDINGS Brain: No restricted diffusion to suggest acute or subacute infarct. No abnormal parenchymal or meningeal enhancement. No acute hemorrhage, mass, mass effect, or midline shift. No hydrocephalus  or extra-axial collection. Normal craniocervical junction. 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 No hemosiderin deposition to suggest remote hemorrhage. Normal cerebral volume for age. Scattered T2 hyperintense foci in the bilateral frontal and parietal white matter, which are not radially oriented to the ventricles nor involving the corpus callosum. Vascular: Normal arterial flow voids. Normal arterial and venous enhancement Skull and upper cervical spine: Normal marrow signal. Other: The mastoids are well aerated. MRI ORBITS FINDINGS Orbits: Evaluation is somewhat limited by ocular motion. Within this limitation, no definite abnormal enhancement or increased T2 hyperintense signal.  No traumatic or inflammatory finding. Globes, optic nerves, orbital fat, extraocular muscles, vascular structures, and lacrimal glands are normal. Visualized sinuses: Mucosal thickening in the ethmoid air cells and maxillary sinuses. Mucous retention cyst in the left maxillary sinus. Soft tissues: Negative. IMPRESSION: 1. No acute intracranial process. No evidence of acute or subacute infarct. No abnormal enhancement. 2. Scattered T2 hyperintense foci in the bilateral frontal and parietal white matter, which are not radially oriented to the ventricles nor involving the corpus callosum. These are nonspecific, but can be seen in the setting of migraines, prior trauma, or early chronic small vessel ischemic change. These are not in a pattern suggestive of demyelinating disease. 3. No abnormal signal or enhancement in the orbits. 4. 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 This could be further evaluated with a nonemergent MRI pituitary protocol. Electronically Signed   By: Wiliam Ke M.D.   On: 03/08/2023 02:20   CT Head Wo Contrast  Result Date: 03/07/2023 CLINICAL DATA:  Headache. EXAM: CT HEAD WITHOUT CONTRAST TECHNIQUE: Contiguous axial images were obtained from the base of the skull through the vertex without intravenous contrast. RADIATION DOSE REDUCTION: This exam was performed according to the departmental dose-optimization program which includes automated exposure control, adjustment of the mA and/or kV according to patient size and/or use of iterative reconstruction technique. COMPARISON:  None Available. FINDINGS: Brain: No evidence of acute infarction, hemorrhage, hydrocephalus, extra-axial collection or mass lesion/mass effect. Vascular: No hyperdense vessel or unexpected calcification. Skull: Normal. Negative for fracture or focal lesion. Sinuses/Orbits: There is mild  bilateral ethmoid sinus mucosal thickening. Other: None. IMPRESSION: 1. No acute intracranial pathology. 2. Mild bilateral ethmoid sinus disease. Electronically Signed   By: Aram Candela M.D.   On: 03/07/2023 22:48   DG Chest 2 View  Result Date: 03/07/2023 CLINICAL DATA:  Congestion, headache and blurred vision. EXAM: CHEST - 2 VIEW COMPARISON:  December 30, 2014 FINDINGS: The heart size and mediastinal contours are within normal limits. Both lungs are clear. The visualized skeletal structures are unremarkable. IMPRESSION: No active cardiopulmonary disease. Electronically Signed   By: Aram Candela M.D.   On: 03/07/2023 22:06    Procedures Procedures    Medications Ordered in ED Medications  ketorolac (TORADOL) 30 MG/ML injection 30 mg (30 mg Intramuscular Given 03/07/23 2235)  dexamethasone (DECADRON) injection 10 mg (10 mg Intramuscular Given 03/07/23 2235)  LORazepam (ATIVAN) tablet 0.5 mg (0.5 mg Oral Given 03/07/23 2348)  gadobutrol (GADAVIST) 1 MMOL/ML injection 8 mL (8 mLs Intravenous Contrast Given 03/08/23 0151)  metoCLOPramide (REGLAN) injection 10 mg (10 mg Intravenous Given 03/08/23 0252)  acetaminophen (TYLENOL) tablet 1,000 mg (1,000 mg Oral Given 03/08/23 0253)  amLODipine (NORVASC) tablet 5  mg (5 mg Oral Given 03/08/23 0253)    ED Course/ Medical Decision Making/ A&P                                 Medical Decision Making Amount and/or Complexity of Data Reviewed Labs: ordered. Radiology: ordered.  Risk OTC drugs. Prescription drug management.   This patient presents to the ED for concern of headache with vision change, this involves an extensive number of treatment options, and is a complaint that carries with it a high risk of complications and morbidity.  The differential diagnosis includes migraine, CVA, MS, IIH, others   Co morbidities that complicate the patient evaluation  Tobacco use   Additional history obtained:  Additional history obtained from  friend at bedside   Lab Tests:  I Ordered, and personally interpreted labs.  The pertinent results include: Grossly unremarkable CBC, BMP, negative troponin, negative pregnancy test   Imaging Studies ordered:  I ordered imaging studies including CT head without contrast, chest x-ray, MRI orbits with and without contrast, MR brain with and without contrast I independently visualized and interpreted imaging which showed no acute findings on chest x-ray. 1. No acute intracranial pathology.  2. Mild bilateral ethmoid sinus disease.   1. No acute intracranial process. No evidence of acute or subacute  infarct. No abnormal enhancement.  2. Scattered T2 hyperintense foci in the bilateral frontal and  parietal white matter, which are not radially oriented to the  ventricles nor involving the corpus callosum. These are nonspecific,  but can be seen in the setting of migraines, prior trauma, or early  chronic small vessel ischemic change. These are not in a pattern  suggestive of demyelinating disease.  3. No abnormal signal or enhancement in the orbits.  4. T1 hypointense, T2 hyperintense structure in the posterior aspect  of the pituitary gland, favored to represent a benign Rathke's cleft  cyst, but incompletely evaluated.   I agree with the radiologist interpretation   Cardiac Monitoring: / EKG:  The patient was maintained on a cardiac monitor.  I personally viewed and interpreted the cardiac monitored which showed an underlying rhythm of: Normal sinus rhythm   Consultations Obtained:  I requested consultation with the neurologist, Dr.Arora,  and discussed lab and imaging findings as well as pertinent plan - they recommend: MRI to evaluate for possible MS. he sees no acute findings on MRI to explain the patient's symptoms.  There are some changes that could be consistent with high blood pressure or other nonacute processes, feels patient may benefit from outpatient ophthalmology  workup for patient's eye symptoms at this time.   Problem List / ED Course / Critical interventions / Medication management   I ordered medication including Toradol and Decadron for headache, Ativan for MRI related anxiety; Reglan and Tylenol for headache, amlodipine for hypertension Reevaluation of the patient after these medicines showed that the patient improved I have reviewed the patients home medicines and have made adjustments as needed   Social Determinants of Health:  Patient is Medicaid for her primary health insurance type   Test / Admission - Considered:  At this time I do not feel the patient has any emergent condition that warrant admission.  MRI with overall nonspecific findings, no sign of CVA, CRAO, MS, or other life-threatening condition.  Labs are overall unremarkable.  Patient's headache finally improved after second set of medications.  Plan to discharge home with contact  information for follow-up with ophthalmology.           Final Clinical Impression(s) / ED Diagnoses Final diagnoses:  Bad headache  Blurry vision, left eye    Rx / DC Orders ED Discharge Orders     None         Pamala Duffel 03/08/23 0326    Sabas Sous, MD 03/08/23 [phone]

## 2023-03-07 NOTE — ED Triage Notes (Signed)
Pt reports headache for the last 4-5 days. Pt reports just about an hour, vision became blurry in her left eye. Pt also states her left shoulder has been hurting.

## 2023-03-08 ENCOUNTER — Emergency Department (HOSPITAL_COMMUNITY): Payer: Medicaid Other

## 2023-03-08 MED ORDER — METOCLOPRAMIDE HCL 5 MG/ML IJ SOLN
10.0000 mg | Freq: Once | INTRAMUSCULAR | Status: AC
  Administered 2023-03-08: 10 mg via INTRAVENOUS
  Filled 2023-03-08: qty 2

## 2023-03-08 MED ORDER — AMLODIPINE BESYLATE 5 MG PO TABS
5.0000 mg | ORAL_TABLET | Freq: Once | ORAL | Status: AC
  Administered 2023-03-08: 5 mg via ORAL
  Filled 2023-03-08: qty 1

## 2023-03-08 MED ORDER — ACETAMINOPHEN 500 MG PO TABS
1000.0000 mg | ORAL_TABLET | Freq: Once | ORAL | Status: AC
  Administered 2023-03-08: 1000 mg via ORAL
  Filled 2023-03-08: qty 2

## 2023-03-08 MED ORDER — GADOBUTROL 1 MMOL/ML IV SOLN
8.0000 mL | Freq: Once | INTRAVENOUS | Status: AC | PRN
  Administered 2023-03-08: 8 mL via INTRAVENOUS

## 2023-03-08 NOTE — ED Notes (Signed)
Patient transported to MRI 

## 2023-03-08 NOTE — Discharge Instructions (Signed)
You were evaluated tonight for a headache with vision changes.  Your MRIs showed no acute findings to explain your symptoms.  I have listed the impression from the MRIs below.  You may find the full report on MyChart.  Please follow-up with ophthalmology for further evaluation of your vision changes.  I have attached contact information for the on-call ophthalmologist.  You may continue to take ibuprofen and acetaminophen at home for headache management.  If you develop any life-threatening symptoms please return to the emergency department.

## 2023-05-19 ENCOUNTER — Encounter (HOSPITAL_BASED_OUTPATIENT_CLINIC_OR_DEPARTMENT_OTHER): Payer: Self-pay | Admitting: Emergency Medicine

## 2023-05-19 ENCOUNTER — Emergency Department (HOSPITAL_BASED_OUTPATIENT_CLINIC_OR_DEPARTMENT_OTHER): Payer: Medicaid Other | Admitting: Radiology

## 2023-05-19 ENCOUNTER — Emergency Department (HOSPITAL_BASED_OUTPATIENT_CLINIC_OR_DEPARTMENT_OTHER)
Admission: EM | Admit: 2023-05-19 | Discharge: 2023-05-20 | Disposition: A | Discharge status: Home / Self Care | Payer: Medicaid Other | Attending: Emergency Medicine | Admitting: Emergency Medicine

## 2023-05-19 ENCOUNTER — Other Ambulatory Visit: Payer: Self-pay

## 2023-05-19 DIAGNOSIS — R079 Chest pain, unspecified: Secondary | ICD-10-CM | POA: Diagnosis not present

## 2023-05-19 DIAGNOSIS — Z91040 Latex allergy status: Secondary | ICD-10-CM | POA: Diagnosis not present

## 2023-05-19 DIAGNOSIS — K2900 Acute gastritis without bleeding: Secondary | ICD-10-CM

## 2023-05-19 DIAGNOSIS — R112 Nausea with vomiting, unspecified: Secondary | ICD-10-CM | POA: Insufficient documentation

## 2023-05-19 DIAGNOSIS — R1013 Epigastric pain: Secondary | ICD-10-CM | POA: Insufficient documentation

## 2023-05-19 DIAGNOSIS — R7989 Other specified abnormal findings of blood chemistry: Secondary | ICD-10-CM

## 2023-05-19 LAB — CBC
HCT: 39.1 % (ref 36.0–46.0)
Hemoglobin: 12.7 g/dL (ref 12.0–15.0)
MCH: 25.3 pg — ABNORMAL LOW (ref 26.0–34.0)
MCHC: 32.5 g/dL (ref 30.0–36.0)
MCV: 78 fL — ABNORMAL LOW (ref 80.0–100.0)
Platelets: 145 10*3/uL — ABNORMAL LOW (ref 150–400)
RBC: 5.01 MIL/uL (ref 3.87–5.11)
RDW: 17.2 % — ABNORMAL HIGH (ref 11.5–15.5)
WBC: 7.5 10*3/uL (ref 4.0–10.5)
nRBC: 0 % (ref 0.0–0.2)

## 2023-05-19 LAB — BASIC METABOLIC PANEL
Anion gap: 9 (ref 5–15)
BUN: 6 mg/dL (ref 6–20)
CO2: 29 mmol/L (ref 22–32)
Calcium: 10.2 mg/dL (ref 8.9–10.3)
Chloride: 98 mmol/L (ref 98–111)
Creatinine, Ser: 0.87 mg/dL (ref 0.44–1.00)
GFR, Estimated: >60 mL/min (ref >60)
Glucose, Bld: 101 mg/dL — ABNORMAL HIGH (ref 70–99)
Potassium: 3.3 mmol/L — ABNORMAL LOW (ref 3.5–5.1)
Sodium: 136 mmol/L (ref 135–145)

## 2023-05-19 LAB — LIPASE, BLOOD: Lipase: 25 U/L (ref 11–51)

## 2023-05-19 LAB — TROPONIN I (HIGH SENSITIVITY): Troponin I (High Sensitivity): 56 ng/L — ABNORMAL HIGH (ref <18)

## 2023-05-19 LAB — PREGNANCY, URINE: Preg Test, Ur: NEGATIVE

## 2023-05-19 MED ORDER — SODIUM CHLORIDE 0.9 % IV BOLUS
1000.0000 mL | Freq: Once | INTRAVENOUS | Status: AC
  Administered 2023-05-20: 1000 mL via INTRAVENOUS

## 2023-05-19 MED ORDER — ONDANSETRON HCL 4 MG/2ML IJ SOLN
4.0000 mg | Freq: Once | INTRAMUSCULAR | Status: AC
  Administered 2023-05-20: 4 mg via INTRAVENOUS
  Filled 2023-05-19: qty 2

## 2023-05-19 MED ORDER — KETOROLAC TROMETHAMINE 30 MG/ML IJ SOLN
30.0000 mg | Freq: Once | INTRAMUSCULAR | Status: AC
  Administered 2023-05-20: 30 mg via INTRAVENOUS
  Filled 2023-05-19: qty 1

## 2023-05-19 NOTE — ED Triage Notes (Signed)
Chest pain/ epigastric pain x 3 days.  Not relieved with OTC indigestion meds. +n/v/d

## 2023-05-20 ENCOUNTER — Emergency Department (HOSPITAL_BASED_OUTPATIENT_CLINIC_OR_DEPARTMENT_OTHER): Payer: Medicaid Other

## 2023-05-20 LAB — HEPATIC FUNCTION PANEL
ALT: 15 U/L (ref 0–44)
AST: 22 U/L (ref 15–41)
Albumin: 4.1 g/dL (ref 3.5–5.0)
Alkaline Phosphatase: 65 U/L (ref 38–126)
Bilirubin, Direct: 0.3 mg/dL — ABNORMAL HIGH (ref 0.0–0.2)
Indirect Bilirubin: 1.1 mg/dL — ABNORMAL HIGH (ref 0.3–0.9)
Total Bilirubin: 1.4 mg/dL — ABNORMAL HIGH (ref <1.2)
Total Protein: 7 g/dL (ref 6.5–8.1)

## 2023-05-20 LAB — TROPONIN I (HIGH SENSITIVITY): Troponin I (High Sensitivity): 57 ng/L — ABNORMAL HIGH (ref <18)

## 2023-05-20 MED ORDER — OMEPRAZOLE 20 MG PO CPDR: 20.0000 mg | DELAYED_RELEASE_CAPSULE | Freq: Two times a day (BID) | ORAL | 1 refills | Status: AC

## 2023-05-20 MED ORDER — OMEPRAZOLE 20 MG PO CPDR: 20.0000 mg | DELAYED_RELEASE_CAPSULE | Freq: Two times a day (BID) | ORAL | 1 refills | Status: DC

## 2023-05-20 MED ORDER — HYDROCODONE-ACETAMINOPHEN 5-325 MG PO TABS: 1.0000 | ORAL_TABLET | Freq: Four times a day (QID) | ORAL | 0 refills | Status: AC | PRN

## 2023-05-20 MED ORDER — SUCRALFATE 1 G PO TABS: 1.0000 g | ORAL_TABLET | Freq: Three times a day (TID) | ORAL | 1 refills | Status: DC

## 2023-05-20 MED ORDER — IOHEXOL 350 MG/ML SOLN
100.0000 mL | Freq: Once | INTRAVENOUS | Status: AC | PRN
  Administered 2023-05-20: 100 mL via INTRAVENOUS

## 2023-05-20 MED ORDER — HYDROCODONE-ACETAMINOPHEN 5-325 MG PO TABS: 1.0000 | ORAL_TABLET | Freq: Four times a day (QID) | ORAL | 0 refills | Status: DC | PRN

## 2023-05-20 MED ORDER — SUCRALFATE 1 G PO TABS: 1.0000 g | ORAL_TABLET | Freq: Three times a day (TID) | ORAL | 1 refills | Status: AC

## 2023-05-20 NOTE — ED Provider Notes (Incomplete)
EMERGENCY DEPARTMENT AT [facility] Provider Note   CSN: [id number] Arrival date & time: 05/19/23  2129     History  Chief Complaint  Patient presents with   Chest Pain   Abdominal Pain    Jillian Walter is a 37 y.o. female.  Patient is a 37 year old female with no significant past medical history.  Patient presenting today for evaluation of chest and abdominal pain.  She reports a 3 day history of discomfort to her epigastric region and center of her chest.  Discomfort has been constant and unrelieved with over-the-counter antiacids.  She describes some nausea, vomiting, and loose stools.  No fevers or chills.  No cough.  Patient has no prior cardiac history.  Only cardiac risk factors are cigarette smoking.  The history is provided by the patient.       Home Medications Prior to Admission medications   Medication Sig Start Date End Date Taking? Authorizing Provider  Cyanocobalamin (VITAMIN B-12 PO) Take by mouth daily. liquid    [provider]  Ferrous Sulfate (IRON PO) Take 1 tablet by mouth daily.    [provider]      Allergies    Latex    Review of Systems   Review of Systems  All other systems reviewed and are negative.   Physical Exam Updated Vital Signs BP (!) 166/109 (BP Location: Right Arm)   Pulse 93   Temp 99.3 F (37.4 C)   Resp 18   LMP 05/15/2023   SpO2 99%  Physical Exam Vitals and nursing note reviewed.  Constitutional:      General: She is not in acute distress.    Appearance: She is well-developed. She is not diaphoretic.  HENT:     Head: Normocephalic and atraumatic.  Cardiovascular:     Rate and Rhythm: Normal rate and regular rhythm.     Heart sounds: No murmur heard.    No friction rub. No gallop.  Pulmonary:     Effort: Pulmonary effort is normal. No respiratory distress.     Breath sounds: Normal breath sounds. No wheezing.  Abdominal:     General: Bowel sounds are normal. There is no  distension.     Palpations: Abdomen is soft.     Tenderness: There is abdominal tenderness.     Comments: There is tenderness to palpation in the epigastric region.  Musculoskeletal:        General: Normal range of motion.     Cervical back: Normal range of motion and neck supple.     Right lower leg: No tenderness. No edema.     Left lower leg: No tenderness. No edema.  Skin:    General: Skin is warm and dry.  Neurological:     General: No focal deficit present.     Mental Status: She is alert and oriented to person, place, and time.     ED Results / Procedures / Treatments   Labs (all labs ordered are listed, but only abnormal results are displayed) Labs Reviewed  BASIC METABOLIC PANEL - Abnormal; Notable for the following components:      Result Value   Potassium 3.3 (*)    Glucose, Bld 101 (*)    All other components within normal limits  CBC - Abnormal; Notable for the following components:   MCV 78.0 (*)    MCH 25.3 (*)    RDW 17.2 (*)    Platelets 145 (*)    All other components  within normal limits  TROPONIN I (HIGH SENSITIVITY) - Abnormal; Notable for the following components:   Troponin I (High Sensitivity) 56 (*)    All other components within normal limits  PREGNANCY, URINE  LIPASE, BLOOD  HEPATIC FUNCTION PANEL  TROPONIN I (HIGH SENSITIVITY)    EKG None  Radiology DG Chest 2 View  Result Date: 05/19/2023 CLINICAL DATA:  Chest pain, epigastric pain EXAM: CHEST - 2 VIEW COMPARISON:  03/07/2023 FINDINGS: The heart size and mediastinal contours are within normal limits. Both lungs are clear. The visualized skeletal structures are unremarkable. IMPRESSION: Normal study. Electronically Signed   By: Charlett Nose M.D.   On: 05/19/2023 22:15    Procedures Procedures  {Document cardiac monitor, telemetry assessment procedure when appropriate:1}  Medications Ordered in ED Medications  ketorolac (TORADOL) 30 MG/ML injection 30 mg (has no administration in time  range)  ondansetron (ZOFRAN) injection 4 mg (has no administration in time range)  sodium chloride 0.9 % bolus 1,000 mL (has no administration in time range)    ED Course/ Medical Decision Making/ A&P   {   Click here for ABCD2, HEART and other calculatorsREFRESH Note before signing :1}                              Medical Decision Making Amount and/or Complexity of Data Reviewed Labs: ordered. Radiology: ordered.  Risk Prescription drug management.   ***  {Document critical care time when appropriate:1} {Document review of labs and clinical decision tools ie heart score, Chads2Vasc2 etc:1}  {Document your independent review of radiology images, and any outside records:1} {Document your discussion with family members, caretakers, and with consultants:1} {Document social determinants of health affecting pt's care:1} {Document your decision making why or why not admission, treatments were needed:1} Final Clinical Impression(s) / ED Diagnoses Final diagnoses:  None    Rx / DC Orders ED Discharge Orders     None

## 2023-05-20 NOTE — ED Notes (Signed)
Reviewed AVS with patient, patient expressed understanding of directions, denies further questions at this time. 

## 2023-05-20 NOTE — Discharge Instructions (Addendum)
Begin tracking omeprazole and Carafate as prescribed.  Begin taking hydrocodone as prescribed as needed for pain.  Follow-up with primary doctor if not improving in the next few days, and return to the ER if your symptoms significantly worsen or change.

## 2023-05-21 ENCOUNTER — Encounter (INDEPENDENT_AMBULATORY_CARE_PROVIDER_SITE_OTHER): Payer: Self-pay

## 2023-05-21 ENCOUNTER — Telehealth (INDEPENDENT_AMBULATORY_CARE_PROVIDER_SITE_OTHER): Payer: Self-pay

## 2023-05-21 NOTE — Telephone Encounter (Signed)
Pt came by the office requesting a hospital ED f/u. I have been made a virtual appt with Jillian Walter for 11/11 at 9:10am. Pt is aware and doesn't have any questions or concerns

## 2023-05-24 ENCOUNTER — Telehealth (INDEPENDENT_AMBULATORY_CARE_PROVIDER_SITE_OTHER): Payer: Medicaid Other | Admitting: Primary Care

## 2023-05-24 ENCOUNTER — Ambulatory Visit: Payer: Medicaid Other | Admitting: Internal Medicine

## 2023-08-23 ENCOUNTER — Ambulatory Visit: Payer: Medicaid Other | Attending: Cardiology | Admitting: Cardiology

## 2023-08-23 DIAGNOSIS — R7989 Other specified abnormal findings of blood chemistry: Secondary | ICD-10-CM | POA: Insufficient documentation

## 2023-08-23 NOTE — Progress Notes (Deleted)
  Cardiology Office Note:  .   Date:  08/23/2023  ID:  Cipriano Bunker, DOB [birth year], MRN [id number] PCP: Reynold Bowen., MD  [facility] Providers Cardiologist:  Truett Mainland, MD PCP: Reynold Bowen., MD  No chief complaint on file.     History of Present Illness: .    Jillian Walter is a 38 y.o. female with ***  Patient was seen at [facility] emergency room in 05/2023 with complaints of abdominal pain for 3 days.  Physical examination was reported with tenderness in epigastric region with no peritoneal signs.  Lab work was only significant for mildly elevated but flat troponin at 56 and 57.  CT of the chest ruled out pulmonary embolism.  CT of the abdomen showed gastritis, but no other acute finding.  Patient symptoms improved with Toradol and Zofran.  She was discharged home on PPI and Carafate, and recommended outpatient cardiology consultation.  There were no vitals filed for this visit.   ROS: *** ROS   Studies Reviewed: .        *** Independently interpreted 05/2023: K 3.3. T. Bili 1.4, indirect bili 1.1. Trop HS 56,57. Chol ***, TG ***, HDL ***, LDL *** HbA1C ***% Hb *** Cr *** ***    Risk Assessment/Calculations:   {Does this patient have ATRIAL FIBRILLATION?:[id number]}     Physical Exam:   Physical Exam   VISIT DIAGNOSES: No diagnosis found.   ASSESSMENT AND PLAN: .    Jillian Walter is a 38 y.o. female with ***  {Are you ordering a CV Procedure (e.g. stress test, cath, DCCV, TEE, etc)?   Press F2        :[id number]}    No orders of the defined types were placed in this encounter.    F/u in ***  Signed, Elder Negus, MD

## 2023-10-21 ENCOUNTER — Encounter: Payer: Self-pay | Admitting: Cardiovascular Disease

## 2023-10-21 NOTE — Progress Notes (Signed)
 No show  This encounter was created in error - please disregard.

## 2023-10-22 ENCOUNTER — Ambulatory Visit: Payer: Medicaid Other | Attending: Cardiology | Admitting: Cardiovascular Disease

## 2023-10-25 ENCOUNTER — Encounter: Payer: Self-pay | Admitting: Cardiovascular Disease
